# Patient Record
Sex: Male | Born: 1965 | Race: White | Hispanic: No | Smoking: Never smoker
Health system: Southern US, Community
[De-identification: ages and names within clinical notes are randomized; demographics above are authoritative.]

## PROBLEM LIST (undated history)

## (undated) DIAGNOSIS — N289 Disorder of kidney and ureter, unspecified: Secondary | ICD-10-CM

## (undated) DIAGNOSIS — IMO0001 Reserved for inherently not codable concepts without codable children: Secondary | ICD-10-CM

## (undated) DIAGNOSIS — E039 Hypothyroidism, unspecified: Secondary | ICD-10-CM

## (undated) DIAGNOSIS — I517 Cardiomegaly: Secondary | ICD-10-CM

## (undated) DIAGNOSIS — T753XXA Motion sickness, initial encounter: Secondary | ICD-10-CM

## (undated) DIAGNOSIS — J45909 Unspecified asthma, uncomplicated: Secondary | ICD-10-CM

## (undated) DIAGNOSIS — N059 Unspecified nephritic syndrome with unspecified morphologic changes: Secondary | ICD-10-CM

## (undated) DIAGNOSIS — R112 Nausea with vomiting, unspecified: Secondary | ICD-10-CM

## (undated) DIAGNOSIS — K219 Gastro-esophageal reflux disease without esophagitis: Secondary | ICD-10-CM

## (undated) DIAGNOSIS — J189 Pneumonia, unspecified organism: Secondary | ICD-10-CM

## (undated) DIAGNOSIS — M109 Gout, unspecified: Secondary | ICD-10-CM

## (undated) DIAGNOSIS — I10 Essential (primary) hypertension: Secondary | ICD-10-CM

## (undated) DIAGNOSIS — R011 Cardiac murmur, unspecified: Secondary | ICD-10-CM

## (undated) DIAGNOSIS — R002 Palpitations: Secondary | ICD-10-CM

## (undated) DIAGNOSIS — Z9889 Other specified postprocedural states: Secondary | ICD-10-CM

## (undated) DIAGNOSIS — E785 Hyperlipidemia, unspecified: Secondary | ICD-10-CM

## (undated) HISTORY — DX: Essential (primary) hypertension: I10

## (undated) HISTORY — DX: Hyperlipidemia, unspecified: E78.5

## (undated) HISTORY — DX: Gout, unspecified: M10.9

## (undated) HISTORY — DX: Unspecified nephritic syndrome with unspecified morphologic changes: N05.9

## (undated) HISTORY — DX: Cardiomegaly: I51.7

## (undated) HISTORY — PX: OTHER SURGICAL HISTORY: SHX169

## (undated) HISTORY — DX: Hypothyroidism, unspecified: E03.9

---

## 1999-01-16 ENCOUNTER — Ambulatory Visit (HOSPITAL_BASED_OUTPATIENT_CLINIC_OR_DEPARTMENT_OTHER): Admission: RE | Admit: 1999-01-16 | Discharge: 1999-01-16 | Payer: Self-pay | Admitting: Orthopedic Surgery

## 1999-01-19 ENCOUNTER — Emergency Department (HOSPITAL_COMMUNITY): Admission: EM | Admit: 1999-01-19 | Discharge: 1999-01-19 | Payer: Self-pay | Admitting: Emergency Medicine

## 2004-11-08 ENCOUNTER — Encounter: Admission: RE | Admit: 2004-11-08 | Discharge: 2004-11-08 | Payer: Self-pay | Admitting: Nephrology

## 2004-11-10 ENCOUNTER — Encounter: Admission: RE | Admit: 2004-11-10 | Discharge: 2004-11-10 | Payer: Self-pay | Admitting: Rheumatology

## 2005-05-09 ENCOUNTER — Ambulatory Visit: Payer: Self-pay | Admitting: Internal Medicine

## 2005-05-16 ENCOUNTER — Ambulatory Visit (HOSPITAL_COMMUNITY): Admission: RE | Admit: 2005-05-16 | Discharge: 2005-05-16 | Payer: Self-pay | Admitting: Nephrology

## 2005-06-20 ENCOUNTER — Ambulatory Visit (HOSPITAL_COMMUNITY): Admission: RE | Admit: 2005-06-20 | Discharge: 2005-06-20 | Payer: Self-pay | Admitting: Orthopedic Surgery

## 2005-10-06 ENCOUNTER — Ambulatory Visit (HOSPITAL_COMMUNITY): Admission: RE | Admit: 2005-10-06 | Discharge: 2005-10-06 | Payer: Self-pay | Admitting: Orthopedic Surgery

## 2008-06-12 ENCOUNTER — Encounter: Admission: RE | Admit: 2008-06-12 | Discharge: 2008-06-12 | Payer: Self-pay | Admitting: Critical Care Medicine

## 2008-07-06 ENCOUNTER — Encounter: Admission: RE | Admit: 2008-07-06 | Discharge: 2008-07-06 | Payer: Self-pay | Admitting: Nephrology

## 2009-06-18 ENCOUNTER — Emergency Department (HOSPITAL_COMMUNITY): Admission: EM | Admit: 2009-06-18 | Discharge: 2009-06-18 | Payer: Self-pay | Admitting: Emergency Medicine

## 2010-03-31 ENCOUNTER — Encounter: Payer: Self-pay | Admitting: Nephrology

## 2010-05-29 LAB — BASIC METABOLIC PANEL
BUN: 18 mg/dL (ref 6–23)
CO2: 28 mEq/L (ref 19–32)
Calcium: 9.3 mg/dL (ref 8.4–10.5)
Creatinine, Ser: 1.17 mg/dL (ref 0.4–1.5)
GFR calc Af Amer: >60 mL/min (ref >60)
Potassium: 3.7 mEq/L (ref 3.5–5.1)
Sodium: 141 mEq/L (ref 135–145)

## 2010-05-29 LAB — DIFFERENTIAL
Basophils Absolute: 0.1 10*3/uL (ref 0.0–0.1)
Monocytes Absolute: 0.8 10*3/uL (ref 0.1–1.0)
Monocytes Relative: 7 % (ref 3–12)
Neutro Abs: 9.2 10*3/uL — ABNORMAL HIGH (ref 1.7–7.7)

## 2010-05-29 LAB — CBC
HCT: 45.2 % (ref 39.0–52.0)
MCV: 90.2 fL (ref 78.0–100.0)
RDW: 12.7 % (ref 11.5–15.5)

## 2010-07-26 NOTE — Op Note (Signed)
[place]  Patient:    David Crosby                      MRN: [id number] Proc. Date: 01/16/99 Adm. Date:  [id number] Attending:  Milly Jakob CC:         Harvie Junior, M.D.                           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  OPERATION PERFORMED: 1. Open reduction internal fixation of first metatarsocuneiform joint. 2. Open reduction internal fixation of second metatarsocuneiform joint. 3. Open reduction internal fixation of third metatarsocuneiform joint. 4. Open reduction internal fixation of medial and second cuneiform joint. 5. Open reduction internal fixation between the medial and third cuneiform joint. 6. Open harvesting of bone graft from distal tibia for use in the fusions    described above.  SURGEON:  Harvie Junior, M.D.  ASSISTANT:  Kerby Less, P.A.  ANESTHESIA:  General.  BRIEF HISTORY:  He is a gentleman who has had a longterm history of a pain in his left foot dating back to some crushing trauma of some long period ago.  The patient has been previously evaluated with bone scan and noted to have bone scan positive at the metatarsocuneiform joints 1 through 3 and the intercuneiform joints.  He had failed prolonged conservative care with casting, anti-inflammatory medications, use of a cane and because of continued complaints of pain in these areas and positivity of bone scan, he was brought to the operating room for fusion of these joints.  DESCRIPTION OF PROCEDURE:  The patient was brought to the operating room and after adequate anesthesia was  obtained with general anesthetic, the patient was placed supine on the operating table.  The left leg was then prepped and draped in the  usual sterile fashion.  Following this, the leg was exsanguinated and a blood pressure tourniquet was inflated to 350 mmHg.  Following this, a dorsal medial incision was made over the first metatarsocuneiform joint.  The joint wsa identified and subperiosteal flaps were raised.  Following this, fluoroscopy was used and incision was made just lateral to the second cuneiform metatarsal joint and the subcutaneous tissues were dissected down to the level of the peristeum.  The subperiosteal flap was then raised from the medial border all the way to the third metatarsocuneiform joint.  The first, second and third metatarsocuneiform  joints were identified as well as the intercuneiform joints beween the first, second and third cuneiforms.  The joints were then denuded of all articular cartilage.  There was clearly evidence of arthritic injury between all of these  joints.  Once ronguer, curets and osteotomes had been used remove all excess cartilage between the two joints and the joints were scalloped and prepared for  fusion, the first metatarsocuneiform joint was evaluated first.  Crossing 3.5 mm cannulated screws were put in place to hold the metatarsal cuneiform joint togetheter and this compression was held across the joint while the screws were put in place.  Excellent fixation was achieved.  At this point attention was turned to the second  metatarsocuneiform joint where a 3.5 mm screw was used to fuse this joint.  Excellent compression was achieved.  A slight gap was developed between the first and second cuneiform at this point and then the third metatarsocuneiform joint was fused with 3.5 mm cannulated screw.  Following this, the bone graft which had been taken during the case was used to place in the gaps.  This was not enough to fill the  gaps and so bone graft was harvested from the distal tibia at a separate procedure.  The procedure was as follows:  An anterior incision was made just medial to the anterior tibialis muscle care being taken to identify and protect the saphenous vein.  Subcutaneous tissues dissected down to the level of the anterior tibial cortex where a window was made with an osteotome and copious cancellous bone was curetted out of this area.  Following this, the window was ut back in place and the periosteal elevation was put back over this cortical window and then the skin was closed with a combination of 4-0 Vicryl and 4-0 nylon. The graft which was harvested at this setting was then placed in between the intercuneiform joints as well as between the metatarsocuneiform joints.  Once the graft had been packed into place, attention was then turned towards a screw to old the cuneiforms first, second and third together.  A guide wire was passed and the screw was put across to hold this joint together.  Excellent compression was achieved with this screw fixation. Following this, attention was turned towards the dorsal wounds.  The tourniquet was let down.  The subperiosteal flap which was raised was reapproximated nicely over the dorsal aspect of the wound and then the skin was approximated with 4-0 Vicryl and the skin was closed with running horizontal mattress sutures.  A sterile compressive dressing was applied at this point and the patient was then taken to the recovery room where he was noted to be in satisfactory condition.  Estimated blood loss for this procedure was less than 50 cc. DD:  01/16/99 TD:  01/17/99 Job: [id number]

## 2010-07-26 NOTE — Op Note (Signed)
NAME:  David Crosby, David Crosby NO.:  [id number]   MEDICAL RECORD NO.:  [id number]          PATIENT TYPE:  AMB   LOCATION:  SDS                          FACILITY:  [facility]   PHYSICIAN:  Harvie Junior, M.D.   DATE OF BIRTH:  01-12-1966   DATE OF PROCEDURE:  06/20/2005  DATE OF DISCHARGE:                                 OPERATIVE REPORT   PREOPERATIVE DIAGNOSIS:  Painful left foot, status post open  reduction/internal fixation and fusion of tarsometatarsal joints, 1-3.   POSTOPERATIVE DIAGNOSIS:  1.  Painful left foot, status post open reduction/internal fixation and      fusion of tarsometatarsal joints, 1-3.  2.  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   PRINCIPLE PROCEDURE:  1.  Exploration of intertarsal joint with synovectomy, basically of the      naviculocuneiform joint.  2.  Hardware removal from the medial side.  3.  Hardware removal from the lateral side of the foot.  These are done      through three separate incisions.   SURGEON:  Harvie Junior, M.D.   ASSISTANT:  Marshia Ly, P.A.   ANESTHESIA:  General.   BRIEF HISTORY:  Mr. Ess is a 45 year old male with a long history of  having had a severe fracture/dislocation of the foot.  We ultimately treated  him with Lisfranc fracture/dislocation fusion surgery with five 3.5 mm  cannulated screws.  He ultimately did well with the surgery and was able to  work for a prolonged period of time but then began having increasing pain.  A lot of this pain was coming from the intertarsal joint, and we had Dr.  Modesto Charon in our office inject this joint, and that seemed to give him good pain  relief.  We talked about fusion of that joint but ultimately felt that might  be worth a try to take the hardware out and do an exploration and  synovectomy of the intertarsal joint to see if that would not help out some,  so he is brought to the operating room for this procedure.   PROCEDURE:  Patient  brought to the operating room.  After adequate  anesthesia was obtained with general anesthetic, the patient was placed  prone on the operating room table.  The left leg was prepped and draped in  the usual sterile fashion.  Following this, fluoroscopic imaging was used to  help isolate the screw heads and then once this was done, the old incision  was explored.  Immediately, the inner cuneiform screw was taken out.  The  cuneiform first metatarsal screws were removed through this medial incision.  Attention was then turned up into the naviculocuneiform joint to sort out  what was going on, is there any chance the screws were up there.  Put a  Therapist, nutritional in the joint, distracted the joint widely.  Did a complete  synovectomy.  Put the Freer down into the joint.  We really could not see  all the way down in.  It did not appear as though those screws came  into the  joint but ultimately felt that their removal would be appropriate.  We then  exploited the lateral foot incision, and through that lateral incision, we  took out the two screws, the most lateral of which, unfortunately, had  overgrown, and we were not able to quite get that screw out, so they  ultimately had to use the easy-out to get that out and had to clear quite a  bit of bone away to get to the screw head itself.  Ultimately, this was able  to be removed.  At that point, we then went back and copiously irrigated the  naviculocuneiform joint and then did a closure in layers.  Fluoro was used  throughout the case to help localize the screws, which were all buried under  bone.  At this point, the foot was  copiously irrigated and suctioned dry, closed in layers. Sterile compressive  dressing was applied as well as a posterior splint.  The patient was taken  to the recovery room and was noted to be in satisfactory condition.  Estimated blood loss for the procedure was none.      Harvie Junior, M.D.  Electronically  Signed     JLG/MEDQ  D:  06/20/2005  T:  06/20/2005  Job:  [id number]

## 2012-05-02 DIAGNOSIS — R079 Chest pain, unspecified: Secondary | ICD-10-CM

## 2012-05-03 ENCOUNTER — Encounter: Payer: Self-pay | Admitting: Cardiovascular Disease

## 2012-05-03 DIAGNOSIS — R0602 Shortness of breath: Secondary | ICD-10-CM

## 2012-05-03 DIAGNOSIS — R079 Chest pain, unspecified: Secondary | ICD-10-CM

## 2012-05-05 ENCOUNTER — Other Ambulatory Visit: Payer: Self-pay | Admitting: *Deleted

## 2012-05-05 DIAGNOSIS — R002 Palpitations: Secondary | ICD-10-CM

## 2012-05-11 ENCOUNTER — Encounter: Payer: Self-pay | Admitting: Cardiology

## 2012-05-13 ENCOUNTER — Ambulatory Visit: Payer: Self-pay | Admitting: *Deleted

## 2012-05-13 DIAGNOSIS — R002 Palpitations: Secondary | ICD-10-CM

## 2012-05-13 NOTE — Progress Notes (Signed)
Patient in office this morning for 48 hour holter monitor.  Placed with instructions given.  Will return monitor on 3/10.

## 2012-05-20 ENCOUNTER — Encounter: Payer: Self-pay | Admitting: Cardiovascular Disease

## 2012-05-20 ENCOUNTER — Ambulatory Visit (INDEPENDENT_AMBULATORY_CARE_PROVIDER_SITE_OTHER): Payer: Self-pay | Admitting: Cardiovascular Disease

## 2012-05-20 VITALS — BP 142/91 | HR 65 | Ht 71.0 in | Wt 287.1 lb

## 2012-05-20 DIAGNOSIS — I10 Essential (primary) hypertension: Secondary | ICD-10-CM | POA: Insufficient documentation

## 2012-05-20 DIAGNOSIS — R002 Palpitations: Secondary | ICD-10-CM | POA: Insufficient documentation

## 2012-05-20 NOTE — Assessment & Plan Note (Signed)
His blood pressure is reasonably controlled. He complains of daytime fatigue and should consider evaluation for sleep apnea.

## 2012-05-20 NOTE — Progress Notes (Signed)
HPI  This is a 46-male who is here today for followup visit after recent hospitalization. He has known history of hypertension, hypothyroidism and obesity. He has no previous cardiac history. He presented recently to [facility] with palpitations described as skipping in his heart associated with mild dizziness. On his initial evaluation there was a mention of chest discomfort. However, after discussion with him he was referring more to fluttering sensation in the chest without chest discomfort. He ruled out for myocardial infarction. Telemetry showed no arrhythmia. He had an echocardiogram done which showed normal LV systolic function, mild left ventricular hypertrophy and no significant valvular abnormalities or pulmonary hypertension.   he had a Holter monitor done but the results are not available. Since hospital discharge, he has been doing reasonably well. He had only mild palpitations but no other symptoms.  No Known Allergies   Current Outpatient Prescriptions on File Prior to Visit  Medication Sig Dispense Refill  . carvedilol (COREG) 25 MG tablet Take 25 mg by mouth 2 (two) times daily with a meal.      . levothyroxine (SYNTHROID, LEVOTHROID) 88 MCG tablet Take 88 mcg by mouth daily.      Marland Kitchen lisinopril (PRINIVIL,ZESTRIL) 20 MG tablet Take 20 mg by mouth daily.       No current facility-administered medications on file prior to visit.     Past Medical History  Diagnosis Date  . Gout   . Cardiomegaly     Seen on chest x-ray  . Hypothyroidism   . Hyperlipidemia   . Hypertension   . Nephritis      Past Surgical History  Procedure Laterality Date  . Left foot surgery      Multiple     Family History  Problem Relation Age of Onset  . Heart attack      Paternal and Maternal grandmother  . Hypertension      Several family member     History   Social History  . Marital Status: Married    Spouse Name: N/A    Number of Children: N/A  . Years of Education:  N/A   Occupational History  . Not on file.   Social History Main Topics  . Smoking status: Never Smoker   . Smokeless tobacco: Not on file  . Alcohol Use: No  . Drug Use: No  . Sexually Active: Not on file   Other Topics Concern  . Not on file   Social History Narrative  . No narrative on file     PHYSICAL EXAM   BP 142/91  Pulse 65  Ht 5\' 11"  (1.803 m)  Wt 287 lb 1.9 oz (130.237 kg)  BMI 40.06 kg/m2  SpO2 95% Constitutional: He is oriented to person, place, and time. He appears well-developed and well-nourished. No distress.  HENT: No nasal discharge.  Head: Normocephalic and atraumatic.  Eyes: Pupils are equal and round. Right eye exhibits no discharge. Left eye exhibits no discharge.  Neck: Normal range of motion. Neck supple. No JVD present. No thyromegaly present.  Cardiovascular: Normal rate, regular rhythm, normal heart sounds and. Exam reveals no gallop and no friction rub. No murmur heard.  Pulmonary/Chest: Effort normal and breath sounds normal. No stridor. No respiratory distress. He has no wheezes. He has no rales. He exhibits no tenderness.  Abdominal: Soft. Bowel sounds are normal. He exhibits no distension. There is no tenderness. There is no rebound and no guarding.  Musculoskeletal: Normal range of motion. He exhibits  no edema and no tenderness.  Neurological: He is alert and oriented to person, place, and time. Coordination normal.  Skin: Skin is warm and dry. No rash noted. He is not diaphoretic. No erythema. No pallor.  Psychiatric: He has a normal mood and affect. His behavior is normal. Judgment and thought content normal.       ASSESSMENT AND PLAN

## 2012-05-20 NOTE — Assessment & Plan Note (Signed)
His symptoms are suggestive of premature beats. Echocardiogram overall was unremarkable except for mild left ventricular hypertrophy. I will review the results of the Holter monitor once available and then notify him. I advised him to decrease caffeine intake. I also asked him to have his thyroid function checked if not already done. I discussed with him the importance of lifestyle modifications, regular exercise and weight loss. He has significant risk factors for coronary artery disease.

## 2012-05-20 NOTE — Patient Instructions (Addendum)
We will call you results of Holter monitor.  Follow up as needed.

## 2012-06-23 ENCOUNTER — Ambulatory Visit (INDEPENDENT_AMBULATORY_CARE_PROVIDER_SITE_OTHER): Payer: Self-pay | Admitting: Physician Assistant

## 2012-06-23 ENCOUNTER — Encounter: Payer: Self-pay | Admitting: Physician Assistant

## 2012-06-23 ENCOUNTER — Other Ambulatory Visit: Payer: Self-pay | Admitting: Physician Assistant

## 2012-06-23 VITALS — BP 153/94 | HR 64 | Ht 71.0 in | Wt 292.8 lb

## 2012-06-23 DIAGNOSIS — R002 Palpitations: Secondary | ICD-10-CM

## 2012-06-23 DIAGNOSIS — I10 Essential (primary) hypertension: Secondary | ICD-10-CM

## 2012-06-23 MED ORDER — AMLODIPINE BESYLATE 5 MG PO TABS: 5.0000 mg | ORAL_TABLET | Freq: Every day | ORAL | Status: DC

## 2012-06-23 MED ORDER — METOPROLOL TARTRATE 25 MG PO TABS: 25.0000 mg | ORAL_TABLET | Freq: Three times a day (TID) | ORAL | Status: DC

## 2012-06-23 MED ORDER — LISINOPRIL 20 MG PO TABS: 20.0000 mg | ORAL_TABLET | Freq: Two times a day (BID) | ORAL | Status: DC

## 2012-06-23 NOTE — Progress Notes (Signed)
Primary Cardiologist: David Gemma, MD   HPI: Patient returns for review of recent 47-hour Holter monitor, ordered by Dr. Kirke Corin at time of last OV, for ongoing evaluation of palpitations. He has normal LVF, by recent echocardiography, and no known history of CAD.   - 48-hour Holter monitor: NSR, rare PVC, rare PAC, brief atrial tach (< 6 beats)  Results reviewed by Dr. Kirke Corin, who recommended continued treatment with carvedilol.  Although he did not experience significant palpitations while wearing a 47 Holter monitor, he has had increased frequency and duration during these last 2-3 weeks. He refers to it as a "fluttering" sensation, lasting as long as 30 minutes in duration. He denies any associated presyncope/syncope. He has also been experiencing significant headaches, with associated SBP readings in the 160-70 range.  No Known Allergies  Current Outpatient Prescriptions  Medication Sig Dispense Refill  . aspirin 81 MG tablet Take 81 mg by mouth daily.      . carvedilol (COREG) 25 MG tablet Take 25 mg by mouth 2 (two) times daily with a meal.      . Febuxostat (ULORIC) 80 MG TABS Take 80 mg by mouth daily.      Marland Kitchen levothyroxine (SYNTHROID, LEVOTHROID) 88 MCG tablet Take 88 mcg by mouth daily.      Marland Kitchen lisinopril (PRINIVIL,ZESTRIL) 20 MG tablet Take 20 mg by mouth daily.       No current facility-administered medications for this visit.    Past Medical History  Diagnosis Date  . Gout   . Cardiomegaly     Seen on chest x-ray  . Hypothyroidism   . Hyperlipidemia   . Nephritis   . Hypertension     Past Surgical History  Procedure Laterality Date  . Left foot surgery      Multiple    History   Social History  . Marital Status: Married    Spouse Name: N/A    Number of Children: N/A  . Years of Education: N/A   Occupational History  . Not on file.   Social History Main Topics  . Smoking status: Never Smoker   . Smokeless tobacco: Not on file  . Alcohol Use: No  .  Drug Use: No  . Sexually Active: Not on file   Other Topics Concern  . Not on file   Social History Narrative  . No narrative on file    Family History  Problem Relation Age of Onset  . Heart attack      Paternal and Maternal grandmother  . Hypertension      Several family member    ROS: no nausea, vomiting; no fever, chills; no melena, hematochezia; no claudication  PHYSICAL EXAM: BP 153/94  Pulse 64  Ht 5\' 11"  (1.803 m)  Wt 292 lb 12.8 oz (132.813 kg)  BMI 40.86 kg/m2  SpO2 97% GENERAL: 47 year old male; NAD HEENT: NCAT, PERRLA, EOMI; sclera clear; no xanthelasma NECK: no bruits; unable to assess JVD, secondary to neck girth LUNGS: CTA bilaterally CARDIAC: RRR (S1, S2); no significant murmurs; no rubs or gallops ABDOMEN: soft, protuberant EXTREMETIES: no significant peripheral edema SKIN: warm/dry; no obvious rash/lesions MUSCULOSKELETAL: no joint deformity NEURO: no focal deficit; NL affect   EKG:    ASSESSMENT & PLAN:  Palpitations Will reevaluate with a 1 week CardioNet monitor, to assess the burden of these premature beats, previously documented as PACs and PVCs, as well as to allow more time for definitive exclusion of PAF, given that he has been experiencing this  on a near daily basis. Regarding medical therapy, I am not convinced that carvedilol is the best treatment modality for these ectopic beats. He was placed on this approximately 6 months ago, for treatment of HTN. He has NL LVF. Therefore, I will discontinue and substitute with Lopressor 25 twice a day, and reassess his clinical status with me in 2 weeks. We'll also order extensive labs including metabolic profile, magnesium level, and TSH.  Hypertension In lieu of carvedilol, will start amlodipine 5 mg daily and increase lisinopril to 20 mg twice a day. Lopressor is also being added, but for treatment of frequent palpitations.    Gene Grady Lucci, PAC

## 2012-06-23 NOTE — Assessment & Plan Note (Addendum)
Will reevaluate with a 1 week CardioNet monitor, to assess the burden of these premature beats, previously documented as PACs and PVCs, as well as to allow more time for definitive exclusion of PAF, given that he has been experiencing this on a near daily basis. Regarding medical therapy, I am not convinced that carvedilol is the best treatment modality for these ectopic beats. He was placed on this approximately 6 months ago, for treatment of HTN. He has NL LVF. Therefore, I will discontinue and substitute with Lopressor 25 twice a day, and reassess his clinical status with me in 2 weeks. We'll also order extensive labs including metabolic profile, magnesium level, and TSH.

## 2012-06-23 NOTE — Assessment & Plan Note (Signed)
In lieu of carvedilol, will start amlodipine 5 mg daily and increase lisinopril to 20 mg twice a day. Lopressor is also being added, but for treatment of frequent palpitations.

## 2012-06-23 NOTE — Patient Instructions (Signed)
   Labs for BMET, Magnesium, TSH  1 week heart monitor  Office will contact with results  Stop Coreg   Begin Lopressor 25mg  three times per day   Increase Lisinopril to 20mg  twice a day    Begin Norvasc 5mg  daily Follow up in  2-3 weeks

## 2012-06-24 LAB — BASIC METABOLIC PANEL
CO2: 25 mEq/L (ref 19–32)
Calcium: 9.7 mg/dL (ref 8.4–10.5)
Chloride: 104 mEq/L (ref 96–112)
Creat: 0.94 mg/dL (ref 0.50–1.35)
Glucose, Bld: 85 mg/dL (ref 70–99)

## 2012-06-24 LAB — MAGNESIUM: Magnesium: 2 mg/dL (ref 1.5–2.5)

## 2012-06-24 LAB — TSH: TSH: 2.997 u[IU]/mL (ref 0.350–4.500)

## 2012-06-28 ENCOUNTER — Other Ambulatory Visit: Payer: Self-pay | Admitting: *Deleted

## 2012-06-28 ENCOUNTER — Encounter: Payer: Self-pay | Admitting: *Deleted

## 2012-06-28 DIAGNOSIS — R002 Palpitations: Secondary | ICD-10-CM

## 2012-06-29 ENCOUNTER — Encounter: Payer: Self-pay | Admitting: *Deleted

## 2012-06-30 DIAGNOSIS — R002 Palpitations: Secondary | ICD-10-CM

## 2012-07-06 ENCOUNTER — Telehealth: Payer: Self-pay | Admitting: Physician Assistant

## 2012-07-06 NOTE — Telephone Encounter (Signed)
Received call from [facility] that patient had transient 3 second pause, sinus bradycardia at 6:28am. Attempted to call patient, LMOM to call us back to see how he is feeling. Monitor had been placed for palpitations but not dizziness or syncope. Suspect pause was r/t sleeping. Strips will be faxed to the office. Will forward to Gene Serpe and [facility] team for review. Reannah Totten PA-C

## 2012-07-06 NOTE — Telephone Encounter (Signed)
Received msg that patient was trying to return my call. I called patient back but again got his voicemail. Was calling to see how he was feeling given his 3-sec pause. I have contacted the [facility] office and spoke with Inocencio Homes RN and they will follow up on trying to contact him again. Jentry Mcqueary PA-C

## 2012-07-06 NOTE — Telephone Encounter (Signed)
Patient returned call.  States he was sleeping this morning and is feeling fine now.  BP & HR checked on home machine now is 154/95  68.  States he did feel like he had a lot of palpitations yesterday.  Already has follow up OV scheduled for 5/7 with Gene Serpe, PA.

## 2012-07-06 NOTE — Telephone Encounter (Signed)
Follow-up:    Patient called in returning your call. Transferred patient to the [place] office for assistance but they declined to speak with him stating that you were the one who called him.  Please call patient back.

## 2012-07-06 NOTE — Telephone Encounter (Signed)
Left message to return call 

## 2012-07-07 ENCOUNTER — Other Ambulatory Visit: Payer: Self-pay | Admitting: *Deleted

## 2012-07-07 MED ORDER — AMLODIPINE BESYLATE 5 MG PO TABS: 10.0000 mg | ORAL_TABLET | Freq: Every day | ORAL | Status: DC

## 2012-07-07 MED ORDER — METOPROLOL TARTRATE 25 MG PO TABS: 12.5000 mg | ORAL_TABLET | Freq: Three times a day (TID) | ORAL | Status: DC

## 2012-07-14 ENCOUNTER — Telehealth: Payer: Self-pay | Admitting: *Deleted

## 2012-07-14 ENCOUNTER — Encounter: Payer: Self-pay | Admitting: Physician Assistant

## 2012-07-14 ENCOUNTER — Ambulatory Visit (INDEPENDENT_AMBULATORY_CARE_PROVIDER_SITE_OTHER): Payer: Self-pay | Admitting: Physician Assistant

## 2012-07-14 VITALS — BP 119/80 | HR 62 | Ht 71.0 in | Wt 293.0 lb

## 2012-07-14 DIAGNOSIS — R002 Palpitations: Secondary | ICD-10-CM

## 2012-07-14 MED ORDER — PINDOLOL 10 MG PO TABS: 10.0000 mg | ORAL_TABLET | Freq: Two times a day (BID) | ORAL | Status: DC

## 2012-07-14 MED ORDER — PINDOLOL 5 MG PO TABS: 5.0000 mg | ORAL_TABLET | Freq: Two times a day (BID) | ORAL | Status: DC

## 2012-07-14 NOTE — Telephone Encounter (Signed)
In for OV today with David Crosby.  Give new rx for Pindolol 5mg  twice a day.  Cost is $88.20.  Patient does not have insurance.  Can this be changed to anything cheaper?

## 2012-07-14 NOTE — Progress Notes (Signed)
Primary Cardiologist: Mady Gemma, MD   HPI: Scheduled early followup, and review of extended event monitoring for ongoing evaluation of palpitations.   During recent OV, I reviewed results of recent 48-hour Holter monitor, ordered by Dr. Kirke Corin, which yielded brief atrial tachycardia, and rare PVCs and PACs. Given his complaint of worsening palpitations, I recommended extending the monitoring period. Although this did not reveal any definite atrial fibrillation/flutter, there were documented 3.0 second pauses around 4 AM, with underlying sinus rhythm.  Regarding medication adjustments, I had substituted carvedilol with Lopressor 25 mg 3 times a day, so as to better suppress his palpitations, and increased lisinopril to 20 daily and added Norvasc 5 mg daily, for more aggressive management of HTN. However, following results of the one week monitor, I weaned him off Lopressor, which he just finished today, and increased Norvasc to 10 mg daily.   He continues to have occasional palpitations, as previously outlined, and suggests that they are more of a bother, than clinically significant. He denies any near syncope or frank syncope, and seems more bothered by these recurrent episodes. As previously outlined, they sometimes last as long as 30 minutes in duration.  No Known Allergies  Current Outpatient Prescriptions  Medication Sig Dispense Refill  . amLODipine (NORVASC) 5 MG tablet Take 2 tablets (10 mg total) by mouth daily.      Marland Kitchen aspirin 81 MG tablet Take 81 mg by mouth daily.      . Febuxostat (ULORIC) 80 MG TABS Take 80 mg by mouth daily.      Marland Kitchen levothyroxine (SYNTHROID, LEVOTHROID) 88 MCG tablet Take 88 mcg by mouth daily.      Marland Kitchen lisinopril (PRINIVIL,ZESTRIL) 20 MG tablet Take 1 tablet (20 mg total) by mouth 2 (two) times daily.  60 tablet  6  . pindolol (VISKEN) 5 MG tablet Take 1 tablet (5 mg total) by mouth 2 (two) times daily.  60 tablet  6   No current facility-administered medications  for this visit.    Past Medical History  Diagnosis Date  . Gout   . Cardiomegaly     Seen on chest x-ray  . Hypothyroidism   . Hyperlipidemia   . Nephritis   . Hypertension     Past Surgical History  Procedure Laterality Date  . Left foot surgery      Multiple    History   Social History  . Marital Status: Married    Spouse Name: N/A    Number of Children: N/A  . Years of Education: N/A   Occupational History  . Not on file.   Social History Main Topics  . Smoking status: Never Smoker   . Smokeless tobacco: Never Used  . Alcohol Use: No  . Drug Use: No  . Sexually Active: Not on file   Other Topics Concern  . Not on file   Social History Narrative  . No narrative on file    Family History  Problem Relation Age of Onset  . Heart attack      Paternal and Maternal grandmother  . Hypertension      Several family member    ROS: no nausea, vomiting; no fever, chills; no melena, hematochezia; no claudication  PHYSICAL EXAM: BP 119/80  Pulse 62  Ht 5\' 11"  (1.803 m)  Wt 293 lb (132.904 kg)  BMI 40.88 kg/m2 GENERAL: 47 year old male; NAD  HEENT: NCAT, PERRLA, EOMI; sclera clear; no xanthelasma  NECK: no bruits; unable to assess JVD, secondary to neck  girth  LUNGS: CTA bilaterally  CARDIAC: RRR (S1, S2); no significant murmurs; no rubs or gallops  ABDOMEN: soft, protuberant  EXTREMETIES: no significant peripheral edema  SKIN: warm/dry; no obvious rash/lesions  MUSCULOSKELETAL: no joint deformity  NEURO: no focal deficit; NL affect    EKG:    ASSESSMENT & PLAN:  Hypertension Much improved following recent medication adjustments, including increasing amlodipine to full dose.  Palpitations Results of recent extended monitoring were reviewed with the patient, including the 3.0 second pause noted during early morning hours. Given this, I weaned him off Lopressor. Nevertheless, he would prefer to not experience these recurrent palpitations. Therefore,  I will place him on pindolol 5 mg twice a day, given that it has ISA properties and will hopefully prevent recurrent, profound bradycardia, albeit nocturnal. I also wonder if this may not be due to sleep apnea, which he states has been evaluated in the past, but for which he apparently did not qualify for CPAP. I do not feel that further monitoring would yield any more results than have been documented thus far: that is, rare PVCs and PACs, with brief atrial tachycardia and no definite atrial fibrillation/flutter.    Gene Miquel Stacks, PAC

## 2012-07-14 NOTE — Assessment & Plan Note (Signed)
Results of recent extended monitoring were reviewed with the patient, including the 3.0 second pause noted during early morning hours. Given this, I weaned him off Lopressor. Nevertheless, he would prefer to not experience these recurrent palpitations. Therefore, I will place him on pindolol 5 mg twice a day, given that it has ISA properties and will hopefully prevent recurrent, profound bradycardia, albeit nocturnal. I also wonder if this may not be due to sleep apnea, which he states has been evaluated in the past, but for which he apparently did not qualify for CPAP. I do not feel that further monitoring would yield any more results than have been documented thus far: that is, rare PVCs and PACs, with brief atrial tachycardia and no definite atrial fibrillation/flutter.

## 2012-07-14 NOTE — Assessment & Plan Note (Signed)
Much improved following recent medication adjustments, including increasing amlodipine to full dose.

## 2012-07-14 NOTE — Patient Instructions (Addendum)
   Begin Pindolol 5mg  twice a day   Continue all other current medications. Follow up in  2 weeks

## 2012-07-16 MED ORDER — ACEBUTOLOL HCL 200 MG PO CAPS: 200.0000 mg | ORAL_CAPSULE | Freq: Two times a day (BID) | ORAL | Status: DC

## 2012-07-16 NOTE — Telephone Encounter (Signed)
Discussed below with Gene Serpe, PA.  Could try Acebutolol (Sectral) 200mg  twice a day.  Call placed to [facility] & his cost would be $44.05.    Notified patient of this & he stated that he would try this medication.  Cost is about half of the Pindolol.

## 2012-07-29 ENCOUNTER — Ambulatory Visit (INDEPENDENT_AMBULATORY_CARE_PROVIDER_SITE_OTHER): Payer: Self-pay | Admitting: Physician Assistant

## 2012-07-29 ENCOUNTER — Encounter: Payer: Self-pay | Admitting: *Deleted

## 2012-07-29 ENCOUNTER — Encounter: Payer: Self-pay | Admitting: Physician Assistant

## 2012-07-29 VITALS — BP 127/78 | HR 66 | Ht 71.0 in | Wt 291.1 lb

## 2012-07-29 DIAGNOSIS — R002 Palpitations: Secondary | ICD-10-CM

## 2012-07-29 DIAGNOSIS — I10 Essential (primary) hypertension: Secondary | ICD-10-CM

## 2012-07-29 DIAGNOSIS — R079 Chest pain, unspecified: Secondary | ICD-10-CM | POA: Insufficient documentation

## 2012-07-29 NOTE — Assessment & Plan Note (Signed)
Markedly improved following recent addition of acebutolol. Continue current dose and reassess clinical status at followup visit.

## 2012-07-29 NOTE — Assessment & Plan Note (Signed)
Will evaluate further with a dobutamine stress echocardiogram to rule out underlying occult ischemia. Patient reports having had a negative stress test, approximately 10 years ago (report unavailable). He has never undergone coronary angiography. He does have CRFs notable for HTN, HLD, and age, and is concerned about these new symptoms. If the study is negative, then no further workup is indicated and he can resume followup in 6 months with Dr. Kirke Corin. If, however, there is any suggestion of ischemia, then we'll have him return and discuss more aggressive evaluation with a diagnostic coronary angiogram. Patient is agreeable with this plan. Acebutolol will be held a.m. of test. Of note, patient had an echocardiogram in February of this year, revealing NL LVF (EF 55-60%), and no noted WMAs.

## 2012-07-29 NOTE — Patient Instructions (Addendum)
   Dobutamine stress echo  Office will contact with results Continue all current medications. Your physician wants you to follow up in: 6 months.  You will receive a reminder letter in the mail one-two months in advance.  If you don't receive a letter, please call our office to schedule the follow up appointment

## 2012-07-29 NOTE — Assessment & Plan Note (Signed)
Remains very well controlled, following our addition of amlodipine, now at full dose, and up titration of ACE inhibitor.

## 2012-07-29 NOTE — Progress Notes (Signed)
Primary Cardiologist: Mady Gemma, MD   HPI: Scheduled 2 week followup.  At time of recent OV, I recommended treatment with Pindol for management of symptomatic palpitations with documented PVCs, PACs, and brief atrial tachycardia, by recent event monitoring. There was no definite atrial fibrillation/flutter. Documented 3.0 second pauses at 4 AM, with underlying NSR, were noted. In light of the marked nocturnal bradycardia, I recommended substituting Lopressor with a beta blocker with ISA properties, instead.  He returns today reporting marked improvement overall in the frequency and intensity of his palpitations. Since starting acebutolol (less expensive than pindolol), he notes an "80%" reduction in his palpitations. He is quite pleased and feels a "whole lot better".  However, he has also noted interim development of new, mild (5/10) pansternal chest pain, characterized as dull, with no associated symptoms or radiation. These have occurred only while sitting, and last only a few seconds in duration. He denies any exertional CP. His last episode occurred approximately one week ago.  No Known Allergies  Current Outpatient Prescriptions  Medication Sig Dispense Refill  . acebutolol (SECTRAL) 200 MG capsule Take 1 capsule (200 mg total) by mouth 2 (two) times daily.  60 capsule  6  . amLODipine (NORVASC) 10 MG tablet Take 10 mg by mouth daily.      Marland Kitchen aspirin 81 MG tablet Take 81 mg by mouth daily.      . Febuxostat (ULORIC) 80 MG TABS Take 80 mg by mouth daily.      Marland Kitchen levothyroxine (SYNTHROID, LEVOTHROID) 88 MCG tablet Take 88 mcg by mouth daily.      Marland Kitchen lisinopril (PRINIVIL,ZESTRIL) 20 MG tablet Take 1 tablet (20 mg total) by mouth 2 (two) times daily.  60 tablet  6   No current facility-administered medications for this visit.    Past Medical History  Diagnosis Date  . Gout   . Cardiomegaly     Seen on chest x-ray  . Hypothyroidism   . Hyperlipidemia   . Nephritis   . Hypertension      Past Surgical History  Procedure Laterality Date  . Left foot surgery      Multiple    History   Social History  . Marital Status: Married    Spouse Name: N/A    Number of Children: N/A  . Years of Education: N/A   Occupational History  . Not on file.   Social History Main Topics  . Smoking status: Never Smoker   . Smokeless tobacco: Never Used  . Alcohol Use: No  . Drug Use: No  . Sexually Active: Not on file   Other Topics Concern  . Not on file   Social History Narrative  . No narrative on file    Family History  Problem Relation Age of Onset  . Heart attack      Paternal and Maternal grandmother  . Hypertension      Several family member    ROS: no nausea, vomiting; no fever, chills; no melena, hematochezia; no claudication  PHYSICAL EXAM: BP 127/78  Pulse 66  Ht 5\' 11"  (1.803 m)  Wt 291 lb 1.9 oz (132.051 kg)  BMI 40.62 kg/m2  SpO2 95% GENERAL: 47 year old male, obese; NAD  HEENT: NCAT, PERRLA, EOMI; sclera clear; no xanthelasma  NECK: no bruits; unable to assess JVD, secondary to neck girth  LUNGS: CTA bilaterally  CARDIAC: RRR (S1, S2); no significant murmurs; no rubs or gallops  ABDOMEN: soft, protuberant  EXTREMETIES: no significant peripheral edema  SKIN:  warm/dry; no obvious rash/lesions  MUSCULOSKELETAL: no joint deformity  NEURO: no focal deficit; NL affect    EKG:    ASSESSMENT & PLAN:  Palpitations Markedly improved following recent addition of acebutolol. Continue current dose and reassess clinical status at followup visit.  Hypertension Remains very well controlled, following our addition of amlodipine, now at full dose, and up titration of ACE inhibitor.  Chest pain Will evaluate further with a dobutamine stress echocardiogram to rule out underlying occult ischemia. Patient reports having had a negative stress test, approximately 10 years ago (report unavailable). He has never undergone coronary angiography. He does  have CRFs notable for HTN, HLD, and age, and is concerned about these new symptoms. If the study is negative, then no further workup is indicated and he can resume followup in 6 months with Dr. Kirke Corin. If, however, there is any suggestion of ischemia, then we'll have him return and discuss more aggressive evaluation with a diagnostic coronary angiogram. Patient is agreeable with this plan. Acebutolol will be held a.m. of test. Of note, patient had an echocardiogram in February of this year, revealing NL LVF (EF 55-60%), and no noted WMAs.    Gene Tamarra Geiselman, PAC

## 2012-07-30 ENCOUNTER — Ambulatory Visit: Payer: Self-pay | Admitting: Physician Assistant

## 2012-08-05 ENCOUNTER — Other Ambulatory Visit: Payer: Self-pay | Admitting: *Deleted

## 2012-08-05 DIAGNOSIS — R002 Palpitations: Secondary | ICD-10-CM

## 2012-08-11 ENCOUNTER — Ambulatory Visit (HOSPITAL_COMMUNITY)
Admission: RE | Admit: 2012-08-11 | Discharge: 2012-08-11 | Disposition: A | Discharge status: Home / Self Care | Payer: Medicaid Other | Source: Ambulatory Visit | Attending: Physician Assistant | Admitting: Physician Assistant

## 2012-08-11 ENCOUNTER — Encounter (HOSPITAL_COMMUNITY): Payer: Self-pay

## 2012-08-11 DIAGNOSIS — R079 Chest pain, unspecified: Secondary | ICD-10-CM

## 2012-08-11 DIAGNOSIS — R002 Palpitations: Secondary | ICD-10-CM

## 2012-08-11 DIAGNOSIS — I10 Essential (primary) hypertension: Secondary | ICD-10-CM | POA: Insufficient documentation

## 2012-08-11 MED ORDER — ATROPINE SULFATE 1 MG/ML IJ SOLN
INTRAMUSCULAR | Status: AC
  Filled 2012-08-11: qty 1

## 2012-08-11 MED ORDER — DOBUTAMINE IN D5W 4-5 MG/ML-% IV SOLN
INTRAVENOUS | Status: AC
  Filled 2012-08-11: qty 250

## 2012-08-11 NOTE — Progress Notes (Signed)
*  PRELIMINARY RESULTS* Echocardiogram Echocardiogram Pharmacologic Stress Test has been performed.  Conrad St[place] Clair 08/11/2012, 12:03 PM

## 2012-08-11 NOTE — Progress Notes (Signed)
Stress Lab Nurses Notes - [facility]  JOSEDANIEL HAYE 08/11/2012 Reason for doing test: Chest Pain and palpitation Type of test: Dobutamine Echo Nurse performing test: Parke Poisson, RN Nuclear Medicine Tech: Not Applicable Echo Tech: Karrie Doffing MD performing test: R. Rothbart & Jacolyn Reedy PA Family MD: Charm Barges Test explained and consent signed: yes IV started: 22g jelco, NS 250 cc, KVO and No redness or edema Symptoms: Nausea Treatment/Intervention: None Reason test stopped: protocol completed After recovery IV was: Discontinued and No redness or edema Patient to return to Nuc. Med at : NA Patient discharged: Home Patient's Condition upon discharge was: stable Comments: During test peak BP 170/67 & HR 136.  Rrecovery BP 120/83 & HR 78.  Symptoms resolved in recovery. Erskine Speed T

## 2012-08-16 ENCOUNTER — Telehealth: Payer: Self-pay | Admitting: Physician Assistant

## 2012-08-16 NOTE — Telephone Encounter (Signed)
Asking for test results on test stress done last week at [facility].  Please call patient.

## 2012-08-18 NOTE — Telephone Encounter (Signed)
Patient notified results has been routed to [employer], [place].  Will notify as soon as I get reply from provider.  Patient verbalized understanding.

## 2012-08-19 ENCOUNTER — Encounter: Payer: Self-pay | Admitting: Cardiology

## 2012-08-20 NOTE — Telephone Encounter (Signed)
Left message to return call 

## 2012-08-20 NOTE — Telephone Encounter (Signed)
Patient notified of negative dobutamine stress echo.

## 2012-12-20 ENCOUNTER — Encounter (HOSPITAL_COMMUNITY): Payer: Self-pay | Admitting: Emergency Medicine

## 2012-12-20 ENCOUNTER — Emergency Department (HOSPITAL_COMMUNITY): Payer: Medicaid Other

## 2012-12-20 ENCOUNTER — Emergency Department (HOSPITAL_COMMUNITY)
Admission: EM | Admit: 2012-12-20 | Discharge: 2012-12-20 | Disposition: A | Discharge status: Home / Self Care | Payer: Medicaid Other | Attending: Emergency Medicine | Admitting: Emergency Medicine

## 2012-12-20 DIAGNOSIS — Z7982 Long term (current) use of aspirin: Secondary | ICD-10-CM | POA: Insufficient documentation

## 2012-12-20 DIAGNOSIS — E785 Hyperlipidemia, unspecified: Secondary | ICD-10-CM | POA: Insufficient documentation

## 2012-12-20 DIAGNOSIS — N201 Calculus of ureter: Secondary | ICD-10-CM | POA: Insufficient documentation

## 2012-12-20 DIAGNOSIS — R1032 Left lower quadrant pain: Secondary | ICD-10-CM | POA: Insufficient documentation

## 2012-12-20 DIAGNOSIS — R112 Nausea with vomiting, unspecified: Secondary | ICD-10-CM | POA: Insufficient documentation

## 2012-12-20 DIAGNOSIS — E039 Hypothyroidism, unspecified: Secondary | ICD-10-CM | POA: Insufficient documentation

## 2012-12-20 DIAGNOSIS — I517 Cardiomegaly: Secondary | ICD-10-CM | POA: Insufficient documentation

## 2012-12-20 DIAGNOSIS — N23 Unspecified renal colic: Secondary | ICD-10-CM

## 2012-12-20 DIAGNOSIS — I10 Essential (primary) hypertension: Secondary | ICD-10-CM | POA: Insufficient documentation

## 2012-12-20 DIAGNOSIS — R61 Generalized hyperhidrosis: Secondary | ICD-10-CM | POA: Insufficient documentation

## 2012-12-20 DIAGNOSIS — Z79899 Other long term (current) drug therapy: Secondary | ICD-10-CM | POA: Insufficient documentation

## 2012-12-20 DIAGNOSIS — M549 Dorsalgia, unspecified: Secondary | ICD-10-CM | POA: Insufficient documentation

## 2012-12-20 LAB — BASIC METABOLIC PANEL
CO2: 29 mEq/L (ref 19–32)
Calcium: 9.3 mg/dL (ref 8.4–10.5)
Creatinine, Ser: 1.59 mg/dL — ABNORMAL HIGH (ref 0.50–1.35)

## 2012-12-20 LAB — CBC WITH DIFFERENTIAL/PLATELET
Basophils Absolute: 0 10*3/uL (ref 0.0–0.1)
Eosinophils Relative: 4 % (ref 0–5)
HCT: 41.4 % (ref 39.0–52.0)
Lymphocytes Relative: 16 % (ref 12–46)
MCH: 30.8 pg (ref 26.0–34.0)
MCHC: 34.5 g/dL (ref 30.0–36.0)
MCV: 89 fL (ref 78.0–100.0)
Monocytes Absolute: 1 10*3/uL (ref 0.1–1.0)
RDW: 12.8 % (ref 11.5–15.5)
WBC: 11.6 10*3/uL — ABNORMAL HIGH (ref 4.0–10.5)

## 2012-12-20 LAB — URINALYSIS, ROUTINE W REFLEX MICROSCOPIC
Bilirubin Urine: NEGATIVE
Glucose, UA: NEGATIVE mg/dL
Ketones, ur: NEGATIVE mg/dL
Protein, ur: NEGATIVE mg/dL

## 2012-12-20 LAB — URINE MICROSCOPIC-ADD ON

## 2012-12-20 MED ORDER — MORPHINE SULFATE 4 MG/ML IJ SOLN
4.0000 mg | Freq: Once | INTRAMUSCULAR | Status: AC
  Administered 2012-12-20: 4 mg via INTRAVENOUS
  Filled 2012-12-20: qty 1

## 2012-12-20 MED ORDER — HYDROCODONE-ACETAMINOPHEN 5-325 MG PO TABS: 1.0000 | ORAL_TABLET | ORAL | Status: DC | PRN

## 2012-12-20 NOTE — ED Provider Notes (Signed)
CSN: [id number]     Arrival date & time 12/20/12  1625 History   First MD Initiated Contact with Patient 12/20/12 2006     Chief Complaint  Patient presents with  . Back Pain  . Abdominal Pain    Patient is a 47 y.o. male presenting with back pain and abdominal pain. The history is provided by the patient.  Back Pain Pain location: left flank area. Quality:  Aching Radiates to: left abdomen. Pain severity:  Moderate (while waiting he went and urinated and since then has felt much better) Onset quality:  Gradual Duration:  1 day Timing:  Constant Progression:  Resolved Chronicity:  New Relieved by:  Nothing Ineffective treatments:  None tried Associated symptoms: abdominal pain   Associated symptoms: no chest pain, no dysuria, no fever, no leg pain, no perianal numbness and no tingling   Abdominal Pain Associated symptoms: nausea and vomiting   Associated symptoms: no chest pain, no dysuria and no fever     Past Medical History  Diagnosis Date  . Gout   . Cardiomegaly     Seen on chest x-ray  . Hypothyroidism   . Hyperlipidemia   . Nephritis   . Hypertension    Past Surgical History  Procedure Laterality Date  . Left foot surgery      Multiple   Family History  Problem Relation Age of Onset  . Heart attack      Paternal and Maternal grandmother  . Hypertension      Several family member   History  Substance Use Topics  . Smoking status: Never Smoker   . Smokeless tobacco: Never Used  . Alcohol Use: No    Review of Systems  Constitutional: Positive for diaphoresis. Negative for fever.  Cardiovascular: Negative for chest pain.  Gastrointestinal: Positive for nausea, vomiting and abdominal pain.  Genitourinary: Negative for dysuria.  Musculoskeletal: Positive for back pain.  Neurological: Negative for tingling.    Allergies  Review of patient's allergies indicates no known allergies.  Home Medications   Current Outpatient Rx  Name  Route  Sig   Dispense  Refill  . acebutolol (SECTRAL) 200 MG capsule   Oral   Take 1 capsule (200 mg total) by mouth 2 (two) times daily.   60 capsule   6     Stop Pindolol, change to Acebutolol on 07/16/2012 du ...   . amLODipine (NORVASC) 5 MG tablet   Oral   Take 5 mg by mouth daily.         Marland Kitchen aspirin 81 MG tablet   Oral   Take 81 mg by mouth daily.         Marland Kitchen ibuprofen (ADVIL,MOTRIN) 200 MG tablet   Oral   Take 200 mg by mouth every 6 (six) hours as needed for pain.         Marland Kitchen levothyroxine (SYNTHROID, LEVOTHROID) 100 MCG tablet   Oral   Take 100 mcg by mouth daily before breakfast.         . lisinopril (PRINIVIL,ZESTRIL) 20 MG tablet   Oral   Take 1 tablet (20 mg total) by mouth 2 (two) times daily.   60 tablet   6     Dose increased 06/23/2012   . lovastatin (MEVACOR) 20 MG tablet   Oral   Take 40 mg by mouth at bedtime.         Marland Kitchen HYDROcodone-acetaminophen (NORCO/VICODIN) 5-325 MG per tablet   Oral   Take 1-2 tablets  by mouth every 4 (four) hours as needed for pain.   16 tablet   0    BP 144/85  Pulse 72  Temp(Src) 97.9 F (36.6 C) (Oral)  Resp 18  Ht 6' (1.829 m)  Wt 291 lb (131.997 kg)  BMI 39.46 kg/m2  SpO2 95% Physical Exam  Nursing note and vitals reviewed. Constitutional: He appears well-developed and well-nourished. No distress.  HENT:  Head: Normocephalic and atraumatic.  Right Ear: External ear normal.  Left Ear: External ear normal.  Eyes: Conjunctivae are normal. Right eye exhibits no discharge. Left eye exhibits no discharge. No scleral icterus.  Neck: Neck supple. No tracheal deviation present.  Cardiovascular: Normal rate, regular rhythm and intact distal pulses.   Pulmonary/Chest: Effort normal and breath sounds normal. No stridor. No respiratory distress. He has no wheezes. He has no rales.  Abdominal: Soft. Bowel sounds are normal. He exhibits no distension. There is no tenderness. There is CVA tenderness. There is no rebound and no  guarding.  Musculoskeletal: He exhibits no edema and no tenderness.       Lumbar back: He exhibits no tenderness.  Neurological: He is alert. He has normal strength. No sensory deficit. Cranial nerve deficit:  no gross defecits noted. He exhibits normal muscle tone. He displays no seizure activity. Coordination normal.  Skin: Skin is warm and dry. No rash noted.  Psychiatric: He has a normal mood and affect.    ED Course  Procedures (including critical care time) Labs Review Labs Reviewed  URINALYSIS, ROUTINE W REFLEX MICROSCOPIC - Abnormal; Notable for the following:    Hgb urine dipstick TRACE (*)    All other components within normal limits  CBC WITH DIFFERENTIAL - Abnormal; Notable for the following:    WBC 11.6 (*)    Neutro Abs 8.2 (*)    All other components within normal limits  BASIC METABOLIC PANEL - Abnormal; Notable for the following:    Glucose, Bld 115 (*)    Creatinine, Ser 1.59 (*)    GFR calc non Af Amer 50 (*)    GFR calc Af Amer 58 (*)    All other components within normal limits  URINE MICROSCOPIC-ADD ON   Imaging Review Ct Abdomen Pelvis Wo Contrast  12/20/2012   CLINICAL DATA:  Left lower quadrant pain and back pain.  EXAM: CT ABDOMEN AND PELVIS WITHOUT CONTRAST  TECHNIQUE: Multidetector CT imaging of the abdomen and pelvis was performed following the standard protocol without intravenous contrast.  COMPARISON:  06/18/2009 CT.  FINDINGS: Clear lung bases. Mild cardiac enlargement.  Fatty infiltration of the liver without focal defects. Normal-appearing gallbladder, spleen, pancreas, and adrenal glands.  4 x 8 mm nonobstructing left lower pole renal calculus has grown from priors. Left renal cortical hypodensities again noted likely corresponding to previous cysts noted in 2010. Moderate left perinephric stranding without evidence for distal ureteral calculus. Mild left hydronephrosis. Left ureter slightly dilated. I suspect there has been recent passage of a stone.   No right renal abnormalities. No hydronephrosis on the right.  Normal-appearing aorta with only mild mural calcification. Negative IVC. No adenopathy.  Normal-appearing stomach, and small bowel. Diverticulosis without diverticulitis, most notable in the transverse colon. Normal appendix.  Normal-appearing bladder. Normal prostate and seminal vesicles. No free fluid or free air. Negative osseous structures.  IMPRESSION: Mild left hydronephrosis, left perinephric stranding, and hydroureter without obstructing ureteral calculus or visible bladder calculus. I suspect there has been recent passage of a left renal calculus.  A 4 x 8 mm stone lies within the dependent portion of the left renal collecting system.  Hepatic steatosis.  No evidence for diverticulitis or intra-abdominal inflammatory process.   Electronically Signed   By: Davonna Belling M.D.   On: 12/20/2012 21:39    EKG Interpretation   None      Medications  morphine 4 MG/ML injection 4 mg (4 mg Intravenous Given 12/20/12 2109)    MDM   1. Ureteral colic    The patient's CT scan suggesting recently passed ureteral stone. This correlates with the patient's history. He felt like his symptoms resolved after urinating in the waiting room.  I will discharge the patient home with a prescription for pain medications to take as needed.   Celene Kras, MD 12/20/12 2155

## 2012-12-20 NOTE — ED Notes (Signed)
Pt reports back pain and left lower quad ab pain since this am, denies any urinary s/s, +nausea, fever off/on. No h/o of stones.

## 2012-12-30 ENCOUNTER — Other Ambulatory Visit: Payer: Self-pay

## 2012-12-30 MED ORDER — AMLODIPINE BESYLATE 5 MG PO TABS: 5.0000 mg | ORAL_TABLET | Freq: Every day | ORAL | Status: DC

## 2013-01-26 ENCOUNTER — Ambulatory Visit: Payer: Self-pay | Admitting: Cardiology

## 2013-02-18 ENCOUNTER — Other Ambulatory Visit: Payer: Self-pay | Admitting: *Deleted

## 2013-02-18 MED ORDER — ACEBUTOLOL HCL 200 MG PO CAPS: 200.0000 mg | ORAL_CAPSULE | Freq: Two times a day (BID) | ORAL | Status: DC

## 2013-04-28 ENCOUNTER — Telehealth: Payer: Self-pay | Admitting: Cardiology

## 2013-04-28 NOTE — Telephone Encounter (Signed)
Thank you for update, I agree with your documentation. PCP may set him up with us if feels cardiac issue, from history does not sound cardiac.
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
## 2013-06-20 ENCOUNTER — Ambulatory Visit (INDEPENDENT_AMBULATORY_CARE_PROVIDER_SITE_OTHER): Payer: Medicare Other | Admitting: Cardiology

## 2013-06-20 ENCOUNTER — Encounter: Payer: Self-pay | Admitting: Cardiology

## 2013-06-20 VITALS — BP 127/72 | HR 65 | Ht 71.0 in | Wt 291.0 lb

## 2013-06-20 DIAGNOSIS — Z79899 Other long term (current) drug therapy: Secondary | ICD-10-CM

## 2013-06-20 DIAGNOSIS — I10 Essential (primary) hypertension: Secondary | ICD-10-CM

## 2013-06-20 DIAGNOSIS — R404 Transient alteration of awareness: Secondary | ICD-10-CM

## 2013-06-20 DIAGNOSIS — R400 Somnolence: Secondary | ICD-10-CM

## 2013-06-20 DIAGNOSIS — R002 Palpitations: Secondary | ICD-10-CM

## 2013-06-20 NOTE — Patient Instructions (Signed)
   Referral to Dr. Andrey CampanileWilson for sleep study Continue all current medications. Labs for BMET, CBC, TSH, FLP  Office will contact with results via phone or letter.   Follow up in  1 month

## 2013-06-20 NOTE — Progress Notes (Signed)
Clinical Summary Mr. [name] is a 48 y.o.male last seen by PA Serpe, this is our first visit together. He is seen for the following medical problems.   1. Palpitations - per notes, previously noted PVCs, PACs, and brief run of atach by monitoring. Also noted 3 second pause in early 4AM hour.  - previously started on acebutolol with good control of symptoms - reports increase in frequency of palpitations over the last few weeks. Occuring approx 1-2 times a day, lasting approx 1 minute. Can also have random episodes of SOB that occur at rest, not neccesarily asssociated with palps.   2. HTN - compliant with meds    Past Medical History  Diagnosis Date  . Gout   . Cardiomegaly     Seen on chest x-ray  . Hypothyroidism   . Hyperlipidemia   . Nephritis   . Hypertension      No Known Allergies   Current Outpatient Prescriptions  Medication Sig Dispense Refill  . acebutolol (SECTRAL) 200 MG capsule Take 1 capsule (200 mg total) by mouth 2 (two) times daily.  60 capsule  0  . amLODipine (NORVASC) 5 MG tablet Take 1 tablet (5 mg total) by mouth daily.  30 tablet  6  . aspirin 81 MG tablet Take 81 mg by mouth daily.      Marland Kitchen. HYDROcodone-acetaminophen (NORCO/VICODIN) 5-325 MG per tablet Take 1-2 tablets by mouth every 4 (four) hours as needed for pain.  16 tablet  0  . ibuprofen (ADVIL,MOTRIN) 200 MG tablet Take 200 mg by mouth every 6 (six) hours as needed for pain.      Marland Kitchen. levothyroxine (SYNTHROID, LEVOTHROID) 100 MCG tablet Take 100 mcg by mouth daily before breakfast.      . lisinopril (PRINIVIL,ZESTRIL) 20 MG tablet Take 1 tablet (20 mg total) by mouth 2 (two) times daily.  60 tablet  6  . lovastatin (MEVACOR) 20 MG tablet Take 40 mg by mouth at bedtime.       No current facility-administered medications for this visit.     Past Surgical History  Procedure Laterality Date  . Left foot surgery      Multiple     No Known Allergies    Family History  Problem Relation  Age of Onset  . Heart attack      Paternal and Maternal grandmother  . Hypertension      Several family member     Social History Mr. [name] reports that he has never smoked. He has never used smokeless tobacco. Mr. [name] reports that he does not drink alcohol.   Review of Systems CONSTITUTIONAL: No weight loss, fever, chills, weakness or fatigue.  HEENT: Eyes: No visual loss, blurred vision, double vision or yellow sclerae.No hearing loss, sneezing, congestion, runny nose or sore throat.  SKIN: No rash or itching.  CARDIOVASCULAR: per HPI RESPIRATORY: SOB.  GASTROINTESTINAL: No anorexia, nausea, vomiting or diarrhea. No abdominal pain or blood.  GENITOURINARY: No burning on urination, no polyuria NEUROLOGICAL: No headache, dizziness, syncope, paralysis, ataxia, numbness or tingling in the extremities. No change in bowel or bladder control.  MUSCULOSKELETAL: No muscle, back pain, joint pain or stiffness.  LYMPHATICS: No enlarged nodes. No history of splenectomy.  PSYCHIATRIC: No history of depression or anxiety.  ENDOCRINOLOGIC: No reports of sweating, cold or heat intolerance. No polyuria or polydipsia.  Marland Kitchen.   Physical Examination p 65 bp 127/72 Wt 291 lbs BMI 41 Gen: resting comfortably, no acute distress HEENT: no  scleral icterus, pupils equal round and reactive, no palptable cervical adenopathy,  CV: RRR, no m/r/g no JVD, no carotid bruits Resp: Clear to auscultation bilaterally GI: abdomen is soft, non-tender, non-distended, normal bowel sounds, no hepatosplenomegaly MSK: extremities are warm, no edema.  Skin: warm, no rash Neuro:  no focal deficits Psych: appropriate affect   Diagnostic Studies  08/2012 DSE Baseline:  - LV, RV, and aortic size were normal. LA size was upper normal. AV and MV were normal. - LV global systolic function was normal. The estimated LV ejection fraction was 60%. - Normal wall motion; no LV regional wall motion abnormalities. Low  dose:  - LV size was normal and appropriately decreased from the prior stage. - LV global systolic function was hyperdynamic and appropriately augmented from the prior stage. The estimated LV ejection fraction was 70%. - Normal wall motion; no LV regional wall motion abnormalities. - No evidence for new LV regional wall motion abnormalities. Peak stress:  - LV size was normal, appropriately decreased from the prior stage, and appropriately decreased from baseline. - LV global systolic function was hyperdynamic, appropriately augmented from the prior stage, and appropriately augmented from baseline. The estimated LV ejection fraction was 75%. - There is no diffuse LV hypokinesis. - No evidence for new LV regional wall motion abnormalities.  ------------------------------------------------------------ Stress echo results: Left ventricular ejection fraction was normal at rest and with stress. There was no echocardiographic evidence for stress-induced ischemia.    Assessment and Plan  1. Palpitations - previously controlled with acebutolol, now with increasing symptoms - repeat BMET, if stable or improved renal function will increase dose. If worst, will change to metoprolol - pending response to increased medical therapy, may need repeat monitor  2. HTN - at goal, continue current meds  3. OSA? + snoring, BMI 41 (severe obesity), some daytime somnolence, HTN, and cardiac arrhythmia. All are associated with possible OSA - refer for sleep study  F/u 1 month  Antoine PocheJonathan F. Briya Lookabaugh, M.D., F.A.C.C.

## 2013-07-05 ENCOUNTER — Telehealth: Payer: Self-pay | Admitting: Cardiology

## 2013-07-05 NOTE — Telephone Encounter (Signed)
Pt called and said he was going in AM to have lab work completed.

## 2013-07-05 NOTE — Telephone Encounter (Signed)
LM for pt to returncall

## 2013-07-06 ENCOUNTER — Other Ambulatory Visit: Payer: Self-pay | Admitting: Cardiology

## 2013-07-06 LAB — LIPID PANEL
CHOL/HDL RATIO: 5.6 ratio
CHOLESTEROL: 196 mg/dL (ref 0–200)
HDL: 35 mg/dL — AB (ref >39)
LDL Cholesterol: 120 mg/dL — ABNORMAL HIGH (ref 0–99)
TRIGLYCERIDES: 205 mg/dL — AB (ref <150)
VLDL: 41 mg/dL — AB (ref 0–40)

## 2013-07-06 LAB — BASIC METABOLIC PANEL
BUN: 18 mg/dL (ref 6–23)
CALCIUM: 9.3 mg/dL (ref 8.4–10.5)
CO2: 28 mEq/L (ref 19–32)
Chloride: 101 mEq/L (ref 96–112)
Creat: 1.01 mg/dL (ref 0.50–1.35)
Glucose, Bld: 113 mg/dL — ABNORMAL HIGH (ref 70–99)
POTASSIUM: 4.3 meq/L (ref 3.5–5.3)
SODIUM: 137 meq/L (ref 135–145)

## 2013-07-06 LAB — CBC
HCT: 41.5 % (ref 39.0–52.0)
HEMOGLOBIN: 15 g/dL (ref 13.0–17.0)
MCH: 31.1 pg (ref 26.0–34.0)
MCHC: 36.1 g/dL — AB (ref 30.0–36.0)
MCV: 86.1 fL (ref 78.0–100.0)
PLATELETS: 250 10*3/uL (ref 150–400)
RBC: 4.82 MIL/uL (ref 4.22–5.81)
RDW: 14 % (ref 11.5–15.5)
WBC: 6.4 10*3/uL (ref 4.0–10.5)

## 2013-07-06 LAB — TSH: TSH: 3.211 u[IU]/mL (ref 0.350–4.500)

## 2013-07-15 ENCOUNTER — Encounter: Payer: Self-pay | Admitting: *Deleted

## 2013-07-27 ENCOUNTER — Encounter: Payer: Self-pay | Admitting: Cardiology

## 2013-07-27 ENCOUNTER — Encounter: Payer: Self-pay | Admitting: *Deleted

## 2013-07-27 ENCOUNTER — Ambulatory Visit (INDEPENDENT_AMBULATORY_CARE_PROVIDER_SITE_OTHER): Payer: Medicare Other | Admitting: Cardiology

## 2013-07-27 VITALS — BP 110/72 | HR 63 | Ht 71.0 in | Wt 289.8 lb

## 2013-07-27 DIAGNOSIS — I10 Essential (primary) hypertension: Secondary | ICD-10-CM

## 2013-07-27 DIAGNOSIS — R079 Chest pain, unspecified: Secondary | ICD-10-CM

## 2013-07-27 DIAGNOSIS — R002 Palpitations: Secondary | ICD-10-CM

## 2013-07-27 MED ORDER — ACEBUTOLOL HCL 200 MG PO CAPS: ORAL_CAPSULE | ORAL | Status: DC

## 2013-07-27 NOTE — Patient Instructions (Signed)
   Increase Acebutolol to 400mg  every morning & 200mg  every evening Continue all other medications.   Your physician has requested that you have a lexiscan myoview. For further information please visit [email or web address]. Please follow instruction sheet, as given. Office will contact with results via phone or letter.   Follow up in  2 month

## 2013-07-27 NOTE — Progress Notes (Signed)
Clinical Summary David Crosby is a 48 y.o.male seen today for follow up of the following medical problems.   1. Palpitations  - per notes, previously noted PVCs, PACs, and brief run of atach by monitoring. Also noted 3 second pause in early 4AM hour.  - previously started on acebutolol with initial good control of symptoms  - reports increase in frequency of palpitations over the last few weeks. Occuring approx 1-2 times a day, lasting approx 1 minute. Can also have random episodes of SOB that occur at rest, not neccesarily asssociated with palps.  - recent TSH 3.2 on 07/06/13  2. Chest pain - started approx 1 month. Dull pain in midchest, 5/10. Never had pain like this before. Occurs at rest or with exertion. Nothing makes better or worst. No assoc SOB, no N/V. Lasts approx 1 minute. Has had 4 episodes over the last few weeks.  - 08/2012 DSE negative.  CAD risk factors: HTN, HL, maternal grandmother MI in 6640s, paternal grandmother MI in 6350s.    3. HTN  - compliant with meds  4. OSA? Sleep study pending Past Medical History  Diagnosis Date  . Gout   . Cardiomegaly     Seen on chest x-ray  . Hypothyroidism   . Hyperlipidemia   . Nephritis   . Hypertension      No Known Allergies   Current Outpatient Prescriptions  Medication Sig Dispense Refill  . acebutolol (SECTRAL) 200 MG capsule Take 1 capsule (200 mg total) by mouth 2 (two) times daily.  60 capsule  0  . amLODipine (NORVASC) 10 MG tablet Take 10 mg by mouth daily.      Marland Kitchen. aspirin 81 MG tablet Take 81 mg by mouth daily.      . Febuxostat (ULORIC) 80 MG TABS Take 1 tablet by mouth as needed.      Marland Kitchen. HYDROcodone-acetaminophen (VICODIN ES) 7.5-750 MG per tablet Take 1 tablet by mouth every 6 (six) hours as needed for pain.      . indomethacin (INDOCIN) 50 MG capsule Take 50 mg by mouth 2 (two) times daily as needed.      Marland Kitchen. levothyroxine (SYNTHROID, LEVOTHROID) 100 MCG tablet Take 100 mcg by mouth daily before breakfast.       . lisinopril (PRINIVIL,ZESTRIL) 20 MG tablet Take 1 tablet (20 mg total) by mouth 2 (two) times daily.  60 tablet  6  . lovastatin (MEVACOR) 20 MG tablet Take 40 mg by mouth at bedtime.       No current facility-administered medications for this visit.     Past Surgical History  Procedure Laterality Date  . Left foot surgery      Multiple     No Known Allergies    Family History  Problem Relation Age of Onset  . Heart attack      Paternal and Maternal grandmother  . Hypertension      Several family member     Social History David Crosby reports that he has never smoked. He has never used smokeless tobacco. David Crosby reports that he does not drink alcohol.   Review of Systems CONSTITUTIONAL: No weight loss, fever, chills, weakness or fatigue.  HEENT: Eyes: No visual loss, blurred vision, double vision or yellow sclerae.No hearing loss, sneezing, congestion, runny nose or sore throat.  SKIN: No rash or itching.  CARDIOVASCULAR: per HPI RESPIRATORY: No shortness of breath, cough or sputum.  GASTROINTESTINAL: No anorexia, nausea, vomiting or diarrhea. No abdominal pain  or blood.  GENITOURINARY: No burning on urination, no polyuria NEUROLOGICAL: No headache, dizziness, syncope, paralysis, ataxia, numbness or tingling in the extremities. No change in bowel or bladder control.  MUSCULOSKELETAL: No muscle, back pain, joint pain or stiffness.  LYMPHATICS: No enlarged nodes. No history of splenectomy.  PSYCHIATRIC: No history of depression or anxiety.  ENDOCRINOLOGIC: No reports of sweating, cold or heat intolerance. No polyuria or polydipsia.  Marland Kitchen.   Physical Examination p 63 bp 110/72 Wt 289 lbs BMI 40 Gen: resting comfortably, no acute distress HEENT: no scleral icterus, pupils equal round and reactive, no palptable cervical adenopathy,  CV: RRR, no m/r/g, no JVD, no carotid bruits Resp: Clear to auscultation bilaterally GI: abdomen is soft, non-tender, non-distended,  normal bowel sounds, no hepatosplenomegaly MSK: extremities are warm, no edema.  Skin: warm, no rash Neuro:  no focal deficits Psych: appropriate affect   Diagnostic Studies  08/2012 DSE  Baseline:  - LV, RV, and aortic size were normal. LA size was upper normal. AV and MV were normal. - LV global systolic function was normal. The estimated LV ejection fraction was 60%. - Normal wall motion; no LV regional wall motion abnormalities. Low dose:  - LV size was normal and appropriately decreased from the prior stage. - LV global systolic function was hyperdynamic and appropriately augmented from the prior stage. The estimated LV ejection fraction was 70%. - Normal wall motion; no LV regional wall motion abnormalities. - No evidence for new LV regional wall motion abnormalities. Peak stress:  - LV size was normal, appropriately decreased from the prior stage, and appropriately decreased from baseline. - LV global systolic function was hyperdynamic, appropriately augmented from the prior stage, and appropriately augmented from baseline. The estimated LV ejection fraction was 75%. - There is no diffuse LV hypokinesis. - No evidence for new LV regional wall motion abnormalities.  ------------------------------------------------------------ Stress echo results: Left ventricular ejection fraction was normal at rest and with stress. There was no echocardiographic evidence for stress-induced ischemia.   Pertinent labs 07/06/13 K 4.3 Cr 1.01 BUN 18  Assessment and Plan   1. Palpitations  - previously controlled with acebutolol, now with increasing symptoms  - will increase acebutolol to 400mg  in AM, continue 200mg  at night as his prior cardiac monitor showed signififcant nighttime bradycardia  2. Chest pain - unclear etiology, he has several CAD risk factors - he cannot run on a treadmill due to chronic leg pain with multiple surgeries - will obtain Lexiscan.   3. HTN  -  at goal, continue current meds   4. OSA?  + snoring, BMI 41 (severe obesity), some daytime somnolence, HTN, and cardiac arrhythmia. All are associated with possible OSA  - awaiting sleep study   F/u 2 months  Antoine PocheJonathan F. Branch, M.D., F.A.C.C.

## 2013-08-04 ENCOUNTER — Encounter (HOSPITAL_COMMUNITY): Payer: Self-pay

## 2013-08-04 ENCOUNTER — Encounter (HOSPITAL_COMMUNITY)
Admission: RE | Admit: 2013-08-04 | Discharge: 2013-08-04 | Disposition: A | Discharge status: Home / Self Care | Payer: Medicare Other | Source: Ambulatory Visit | Attending: Cardiology | Admitting: Cardiology

## 2013-08-04 ENCOUNTER — Ambulatory Visit (HOSPITAL_COMMUNITY)
Admission: RE | Admit: 2013-08-04 | Discharge: 2013-08-04 | Disposition: A | Discharge status: Home / Self Care | Payer: Medicare Other | Source: Ambulatory Visit | Attending: Cardiology | Admitting: Cardiology

## 2013-08-04 DIAGNOSIS — R002 Palpitations: Secondary | ICD-10-CM | POA: Insufficient documentation

## 2013-08-04 DIAGNOSIS — R079 Chest pain, unspecified: Secondary | ICD-10-CM

## 2013-08-04 HISTORY — DX: Disorder of kidney and ureter, unspecified: N28.9

## 2013-08-04 HISTORY — DX: Unspecified asthma, uncomplicated: J45.909

## 2013-08-04 MED ORDER — TECHNETIUM TC 99M SESTAMIBI GENERIC - CARDIOLITE
30.0000 | Freq: Once | INTRAVENOUS | Status: AC | PRN
  Administered 2013-08-04: 30 via INTRAVENOUS

## 2013-08-04 MED ORDER — SODIUM CHLORIDE 0.9 % IJ SOLN
INTRAMUSCULAR | Status: AC
  Administered 2013-08-04: 10 mL via INTRAVENOUS
  Filled 2013-08-04: qty 10

## 2013-08-04 MED ORDER — TECHNETIUM TC 99M SESTAMIBI - CARDIOLITE
10.0000 | Freq: Once | INTRAVENOUS | Status: AC | PRN
  Administered 2013-08-04: 09:00:00 10 via INTRAVENOUS

## 2013-08-04 MED ORDER — REGADENOSON 0.4 MG/5ML IV SOLN
INTRAVENOUS | Status: AC
  Administered 2013-08-04: 0.4 mg via INTRAVENOUS
  Filled 2013-08-04: qty 5

## 2013-08-04 NOTE — Progress Notes (Signed)
Stress Lab Nurses Notes - [facility]  JEFFIE WOOLFORD 08/04/2013 Reason for doing test: Chest Pain and palpitation Type of test: Marlane Hatcher Nurse performing test: Parke Poisson, RN Nuclear Medicine Tech: Lyndel Pleasure Echo Tech: Not Applicable MD performing test: Ival Bible /K.Lyman Bishop NP Family MD: Samuel Jester, DO Test explained and consent signed: yes IV started: 22g jelco, Saline lock flushed, No redness or edema and Saline lock started in radiology Symptoms: Chest tightness & pressure Treatment/Intervention: None Reason test stopped: protocol completed After recovery IV was: Discontinued via X-ray tech and No redness or edema Patient to return to Nuc. Med at : 11:05 Patient discharged: Home Patient's Condition upon discharge was: stable Comments: During test BP 110/77 & HR 78.  Recovery BP 118/75 & HR 73.  Symptoms resolved in recovery. Tawni Millers

## 2013-08-15 ENCOUNTER — Telehealth: Payer: Self-pay | Admitting: *Deleted

## 2013-08-15 NOTE — Telephone Encounter (Signed)
Notes Recorded by Lesle Chris, LPN on 08/09/353 at 1:38 PM Patient notified and verbalized understanding. Follow up scheduled for 09/29/2013 with Dr. Wyline Mood.

## 2013-08-15 NOTE — Telephone Encounter (Signed)
Message copied by Lesle Chris on Mon Aug 15, 2013  1:39 PM ------      Message from: Jodelle Gross      Created: Thu Aug 04, 2013  4:39 PM       Test results reviewed. No evidence of ischemia. No changes in medication regimen ------

## 2013-09-29 ENCOUNTER — Ambulatory Visit (INDEPENDENT_AMBULATORY_CARE_PROVIDER_SITE_OTHER): Payer: Medicare Other | Admitting: Cardiology

## 2013-09-29 ENCOUNTER — Encounter: Payer: Self-pay | Admitting: Cardiology

## 2013-09-29 VITALS — BP 113/71 | HR 62 | Ht 71.0 in | Wt 262.0 lb

## 2013-09-29 DIAGNOSIS — R400 Somnolence: Secondary | ICD-10-CM

## 2013-09-29 DIAGNOSIS — E785 Hyperlipidemia, unspecified: Secondary | ICD-10-CM

## 2013-09-29 DIAGNOSIS — R404 Transient alteration of awareness: Secondary | ICD-10-CM

## 2013-09-29 DIAGNOSIS — R002 Palpitations: Secondary | ICD-10-CM

## 2013-09-29 DIAGNOSIS — R0789 Other chest pain: Secondary | ICD-10-CM

## 2013-09-29 DIAGNOSIS — I10 Essential (primary) hypertension: Secondary | ICD-10-CM

## 2013-09-29 MED ORDER — RANITIDINE HCL 150 MG PO TABS: 150.0000 mg | ORAL_TABLET | Freq: Two times a day (BID) | ORAL | Status: DC

## 2013-09-29 MED ORDER — AMLODIPINE BESYLATE 5 MG PO TABS: 5.0000 mg | ORAL_TABLET | Freq: Every day | ORAL | Status: DC

## 2013-09-29 NOTE — Patient Instructions (Signed)
   Decrease Amlodipine to 5mg  daily - printed script provided today  Begin Zantac 150mg  twice a day  - may buy OTC Continue all other medications.   Referral to Dr. Andrey CampanileWilson for somnolence Please call office in about 2 weeks for update on dizziness Your physician wants you to follow up in:  1 year.  You will receive a reminder letter in the mail one-two months in advance.  If you don't receive a letter, please call our office to schedule the follow up appointment

## 2013-09-29 NOTE — Progress Notes (Signed)
Clinical Summary David Crosby is a 48 y.o.male seen today for follow up of the following medical problems.   1. Palpitations  - per notes, previously noted PVCs, PACs, and brief run of atach by monitoring. Also noted 3 second pause in early 4AM hour.  - previously started on acebutolol with initial good control of symptoms -  Last visit reported increased symptoms,increased AM acetbutolol to 400mg  daily, kept evening dose at 200mg  as his prior event monitor showed some nighttime bradycardia  - since last visit still with some symptoms, occurs every few days. Lasts approx 1 minute or so. Overall fairly mild   2. Chest pain  - last visit described approx 1 month of dull pain in midchest, 5/10. Never had pain like this before. Occurs at rest or with exertion. Nothing makes better or worst. No assoc SOB, no N/V. Lasts approx 1 minute.  - 08/2012 DSE negative.  - referred again for stress testing due to new symptoms, MPI 07/2013 low risk, no specific ischemia. Likely subdiaphragmatic attenuation.   3. HTN  - compliant with meds  - does not check regluarly - reports some occasional orthostatic symptoms with standing, dizziness  4. OSA?  Sleep study pending, he states he was not scheduled with sleep medicine since last visit  5. Hyperlipidemia - compliant with statin  6. Weight loss - reports unintential weight loss over last several months, approx 30 lbs Past Medical History  Diagnosis Date  . Gout   . Cardiomegaly     Seen on chest x-ray  . Hypothyroidism   . Hyperlipidemia   . Nephritis   . Hypertension   . Renal insufficiency   . Asthma      No Known Allergies   Current Outpatient Prescriptions  Medication Sig Dispense Refill  . acebutolol (SECTRAL) 200 MG capsule Take 400mg  (2 caps) by mouth every morning & 200mg  (1 tab) every evening  90 capsule  6  . amLODipine (NORVASC) 10 MG tablet Take 10 mg by mouth daily.      Marland Kitchen. aspirin 81 MG tablet Take 81 mg by mouth daily.       . febuxostat (ULORIC) 40 MG tablet Take 40 mg by mouth every evening.      . Febuxostat (ULORIC) 80 MG TABS Take 1 tablet by mouth every morning.       Marland Kitchen. HYDROcodone-acetaminophen (VICODIN ES) 7.5-750 MG per tablet Take 1 tablet by mouth every 6 (six) hours as needed for pain.      Marland Kitchen. levothyroxine (SYNTHROID, LEVOTHROID) 100 MCG tablet Take 100 mcg by mouth daily before breakfast.      . lisinopril (PRINIVIL,ZESTRIL) 20 MG tablet Take 1 tablet (20 mg total) by mouth 2 (two) times daily.  60 tablet  6  . lovastatin (MEVACOR) 20 MG tablet Take 40 mg by mouth at bedtime.       No current facility-administered medications for this visit.     Past Surgical History  Procedure Laterality Date  . Left foot surgery      Multiple     No Known Allergies    Family History  Problem Relation Age of Onset  . Heart attack      Paternal and Maternal grandmother  . Hypertension      Several family member     Social History David Crosby reports that he has never smoked. He has never used smokeless tobacco. David Crosby reports that he does not drink alcohol.   Review of  Systems CONSTITUTIONAL: + weight loss HEENT: Eyes: No visual loss, blurred vision, double vision or yellow sclerae.No hearing loss, sneezing, congestion, runny nose or sore throat.  SKIN: No rash or itching.  CARDIOVASCULAR: per HPI RESPIRATORY: No shortness of breath, cough or sputum.  GASTROINTESTINAL: No anorexia, nausea, vomiting or diarrhea. No abdominal pain or blood.  GENITOURINARY: No burning on urination, no polyuria NEUROLOGICAL: occas dizziness MUSCULOSKELETAL: No muscle, back pain, joint pain or stiffness.  LYMPHATICS: No enlarged nodes. No history of splenectomy.  PSYCHIATRIC: No history of depression or anxiety.  ENDOCRINOLOGIC: No reports of sweating, cold or heat intolerance. No polyuria or polydipsia.  Marland Kitchen   Physical Examination p 62 bp 113/71 Wt 262 lbs BMI 36 Gen: resting comfortably, no acute  distress HEENT: no scleral icterus, pupils equal round and reactive, no palptable cervical adenopathy,  CV: RRR, no m/r/g, no JVD, no carotid bruits Resp: Clear to auscultation bilaterally GI: abdomen is soft, non-tender, non-distended, normal bowel sounds, no hepatosplenomegaly MSK: extremities are warm, no edema.  Skin: warm, no rash Neuro:  no focal deficits Psych: appropriate affect   Diagnostic Studies 08/2012 DSE  Baseline:  - LV, RV, and aortic size were normal. LA size was upper normal. AV and MV were normal. - LV global systolic function was normal. The estimated LV ejection fraction was 60%. - Normal wall motion; no LV regional wall motion abnormalities. Low dose:  - LV size was normal and appropriately decreased from the prior stage. - LV global systolic function was hyperdynamic and appropriately augmented from the prior stage. The estimated LV ejection fraction was 70%. - Normal wall motion; no LV regional wall motion abnormalities. - No evidence for new LV regional wall motion abnormalities. Peak stress:  - LV size was normal, appropriately decreased from the prior stage, and appropriately decreased from baseline. - LV global systolic function was hyperdynamic, appropriately augmented from the prior stage, and appropriately augmented from baseline. The estimated LV ejection fraction was 75%. - There is no diffuse LV hypokinesis. - No evidence for new LV regional wall motion abnormalities.  ------------------------------------------------------------ Stress echo results: Left ventricular ejection fraction was normal at rest and with stress. There was no echocardiographic evidence for stress-induced ischemia.  Pertinent labs 07/06/13  K 4.3 Cr 1.01 BUN 18   07/2013 MPI Tomographic views were obtained using the short axis, vertical long  axis, and horizontal long axis planes. There is a small, mild  intensity mid to basal inferolateral defect that is  predominantly  fixed with a partial degree of reversibility in the basal zone. This  likely represents variable soft tissue attenuation, cannot  completely exclude a mild degree of ischemia.  Gated imaging reveals an EDV of 142, ESV of 60, LVEF of 58%, and no  focal wall motion abnormalities.  IMPRESSION:  Overall low risk Lexiscan Cardiolite. There were no diagnostic ST  segment abnormalities or arrhythmias. Perfusion imaging is  suggestive of variable soft tissue attenuation, particularly in the  diaphragmatic distribution. Cannot completely exclude a very small  region of ischemia in the mid to basal inferolateral wall. LVEF is  normal at 58% with normal volumes and wall motion.   Assessment and Plan  1. Palpitations  - reasonable control with current doses of acebutolol, avoid further titration due to low normal heart rates, and noted night time bradycardia on previous monitor - awaiting sleep study, if potential OSA then treatment likely would improve his symptoms  2. Chest pain  - negative stress testing, does not  appear to be cardiac - will start emperic H2 blocker and follow possible GI symptoms  3. HTN  - describes some orthostatic symptoms - with his recent weight loss his bp has improved, likely does not require as high doses of meds - decrease norvasc to 5mg  daily, he will call us in 2 weeks and let us know how symptoms are. If still ongoing stop norvasc. Try to keep acebutolol at current doses given his palpitations.   4. OSA?  + snoring, BMI 41 (severe obesity), some daytime somnolence, HTN, and cardiac arrhythmia. All are associated with possible OSA  - awaiting sleep study  5. Hyperlipidemia - continue current statin  6. Weight loss - I have asked him to discuss this with his pcp   F/u 1 year Antoine Poche, M.D., F.A.C.C.

## 2014-05-09 ENCOUNTER — Telehealth: Payer: Self-pay | Admitting: *Deleted

## 2014-05-09 NOTE — Telephone Encounter (Signed)
Pt will come to office at 9:40 AM.

## 2014-05-09 NOTE — Telephone Encounter (Signed)
Can add him on tomorrow at 940 if he can make it. If not then earliest available that I have, 940s work on other days too  Dominga FerryJ Simara Rhyner MD

## 2014-05-09 NOTE — Telephone Encounter (Signed)
Pt is c/o SOB when walking/working. Has appt on 3/22 but wants to know if he needs appt sooner. BP and HR WNL Will forward to Dr. Wyline MoodBranch

## 2014-05-10 ENCOUNTER — Encounter: Payer: Self-pay | Admitting: Cardiology

## 2014-05-10 ENCOUNTER — Ambulatory Visit (INDEPENDENT_AMBULATORY_CARE_PROVIDER_SITE_OTHER): Payer: Medicare Other | Admitting: Cardiology

## 2014-05-10 VITALS — BP 150/94 | HR 80 | Ht 71.0 in | Wt 271.0 lb

## 2014-05-10 DIAGNOSIS — E039 Hypothyroidism, unspecified: Secondary | ICD-10-CM

## 2014-05-10 DIAGNOSIS — Z1329 Encounter for screening for other suspected endocrine disorder: Secondary | ICD-10-CM

## 2014-05-10 DIAGNOSIS — R0602 Shortness of breath: Secondary | ICD-10-CM

## 2014-05-10 MED ORDER — ALBUTEROL SULFATE HFA 108 (90 BASE) MCG/ACT IN AERS: 2.0000 | INHALATION_SPRAY | Freq: Four times a day (QID) | RESPIRATORY_TRACT | Status: DC | PRN

## 2014-05-10 NOTE — Progress Notes (Signed)
Clinical Summary Mr. Wurm is a 49 y.o.male seen today for a focused visit on new symptoms of shortness of breath.    1. SOB - ongoing for 3-4 weeks. Mainly DOE with activity. +cough, non-productive. Cough is new. Denies any recent cold like symptoms. No fevers or chills. Has noted some wheezing at times. No nasal congestion. No LE edema. No orthopnea.  - episode yesterday while carrying boxes very diaphoretic, SOB. Felt dizzy.       Past Medical History  Diagnosis Date  . Gout   . Cardiomegaly     Seen on chest x-ray  . Hypothyroidism   . Hyperlipidemia   . Nephritis   . Hypertension   . Renal insufficiency   . Asthma      No Known Allergies   Current Outpatient Prescriptions  Medication Sig Dispense Refill  . acebutolol (SECTRAL) 200 MG capsule Take  (2 caps) by mouth every morning &  (1 tab) every evening 90 capsule 6  . amLODipine (NORVASC) 5 MG tablet Take 1 tablet (5 mg total) by mouth daily. 30 tablet 11  . aspirin 81 MG tablet Take 81 mg by mouth daily.    . febuxostat (ULORIC) 40 MG tablet Take 40 mg by mouth daily. Take two  in AM one 40 in PM    . HYDROcodone-acetaminophen (VICODIN ES) 7.5-750 MG per tablet Take 1 tablet by mouth every 6 (six) hours as needed for pain.    Marland Kitchen levothyroxine (SYNTHROID, LEVOTHROID) 100 MCG tablet Take 100 mcg by mouth daily before breakfast.    . lisinopril (PRINIVIL,ZESTRIL) 20 MG tablet Take 1 tablet (20 mg total) by mouth 2 (two) times daily. 60 tablet 6  . lovastatin (MEVACOR) 20 MG tablet Take 40 mg by mouth at bedtime.    . ranitidine (ZANTAC) 150 MG tablet Take 1 tablet (150 mg total) by mouth 2 (two) times daily.     No current facility-administered medications for this visit.     Past Surgical History  Procedure Laterality Date  . Left foot surgery      Multiple     No Known Allergies    Family History  Problem Relation Age of Onset  . Heart attack      Paternal and Maternal grandmother   . Hypertension      Several family member     Social History Mr. Kirschenmann reports that he has never smoked. He has never used smokeless tobacco. Mr. Kemppainen reports that he does not drink alcohol.   Review of Systems CONSTITUTIONAL: No weight loss, fever, chills, weakness or fatigue.  HEENT: Eyes: No visual loss, blurred vision, double vision or yellow sclerae.No hearing loss, sneezing, congestion, runny nose or sore throat.  SKIN: No rash or itching.  CARDIOVASCULAR: no chest pain RESPIRATORY: per HPI  GASTROINTESTINAL: No anorexia, nausea, vomiting or diarrhea. No abdominal pain or blood.  GENITOURINARY: No burning on urination, no polyuria NEUROLOGICAL: No headache, dizziness, syncope, paralysis, ataxia, numbness or tingling in the extremities. No change in bowel or bladder control.  MUSCULOSKELETAL: No muscle, back pain, joint pain or stiffness.  LYMPHATICS: No enlarged nodes. No history of splenectomy.  PSYCHIATRIC: No history of depression or anxiety.  ENDOCRINOLOGIC: No reports of sweating, cold or heat intolerance. No polyuria or polydipsia.  Marland Kitchen   Physical Examination p 80 bp 150/94 Wt 271 lbs BMI 38 Gen: resting comfortably, no acute distress HEENT: no scleral icterus, pupils equal round and reactive, no palptable cervical adenopathy,  CV: RRR, no m/r/g, no JVD Resp: Clear to auscultation bilaterally GI: abdomen is soft, non-tender, non-distended, normal bowel sounds, no hepatosplenomegaly MSK: extremities are warm, no edema.  Skin: warm, no rash Neuro:  no focal deficits Psych: appropriate affect   Diagnostic Studies 08/2012 DSE  Baseline:  - LV, RV, and aortic size were normal. LA size was upper normal. AV and MV were normal. - LV global systolic function was normal. The estimated LV ejection fraction was 60%. - Normal wall motion; no LV regional wall motion abnormalities. Low dose:  - LV size was normal and appropriately decreased from the prior stage. -  LV global systolic function was hyperdynamic and appropriately augmented from the prior stage. The estimated LV ejection fraction was 70%. - Normal wall motion; no LV regional wall motion abnormalities. - No evidence for new LV regional wall motion abnormalities. Peak stress:  - LV size was normal, appropriately decreased from the prior stage, and appropriately decreased from baseline. - LV global systolic function was hyperdynamic, appropriately augmented from the prior stage, and appropriately augmented from baseline. The estimated LV ejection fraction was 75%. - There is no diffuse LV hypokinesis. - No evidence for new LV regional wall motion abnormalities.  ------------------------------------------------------------ Stress echo results: Left ventricular ejection fraction was normal at rest and with stress. There was no echocardiographic evidence for stress-induced ischemia.  Pertinent labs 07/06/13  K 4.3 Cr 1.01 BUN 18   07/2013 MPI Tomographic views were obtained using the short axis, vertical long  axis, and horizontal long axis planes. There is a small, mild  intensity mid to basal inferolateral defect that is predominantly  fixed with a partial degree of reversibility in the basal zone. This  likely represents variable soft tissue attenuation, cannot  completely exclude a mild degree of ischemia.  Gated imaging reveals an EDV of 142, ESV of 60, LVEF of 58%, and no  focal wall motion abnormalities.  IMPRESSION:  Overall low risk Lexiscan Cardiolite. There were no diagnostic ST  segment abnormalities or arrhythmias. Perfusion imaging is  suggestive of variable soft tissue attenuation, particularly in the  diaphragmatic distribution. Cannot completely exclude a very small  region of ischemia in the mid to basal inferolateral wall. LVEF is  normal at 58% with normal volumes and wall motion.    Assessment and Plan   1. SOB - unclear etiology. Will  check CXR along with labs including a BNP       Antoine PocheJonathan F. Camyah Pultz, M.D.

## 2014-05-10 NOTE — Patient Instructions (Signed)
Your physician recommends that you schedule a follow-up appointment in: 4-6 WEEKS WITH DR. BRANCH  Your physician recommends that you continue on your current medications as directed. Please refer to the Current Medication list given to you today.  PLEASE USE INHALER AS DIRECTED   Your physician recommends that you return for lab work CBC/BNP/BMP/TSH  A chest x-ray takes a picture of the organs and structures inside the chest, including the heart, lungs, and blood vessels. This test can show several things, including, whether the heart is enlarges; whether fluid is building up in the lungs; and whether pacemaker / defibrillator leads are still in place.  Thank you for choosing [facility]!!

## 2014-05-11 ENCOUNTER — Other Ambulatory Visit: Payer: Self-pay | Admitting: Cardiology

## 2014-05-11 ENCOUNTER — Ambulatory Visit (HOSPITAL_COMMUNITY)
Admission: RE | Admit: 2014-05-11 | Discharge: 2014-05-11 | Disposition: A | Discharge status: Home / Self Care | Payer: Medicare Other | Source: Ambulatory Visit | Attending: Cardiology | Admitting: Cardiology

## 2014-05-11 DIAGNOSIS — R0602 Shortness of breath: Secondary | ICD-10-CM | POA: Insufficient documentation

## 2014-05-11 DIAGNOSIS — I10 Essential (primary) hypertension: Secondary | ICD-10-CM | POA: Insufficient documentation

## 2014-05-11 DIAGNOSIS — R05 Cough: Secondary | ICD-10-CM | POA: Insufficient documentation

## 2014-05-11 DIAGNOSIS — I517 Cardiomegaly: Secondary | ICD-10-CM | POA: Diagnosis not present

## 2014-05-11 DIAGNOSIS — R0789 Other chest pain: Secondary | ICD-10-CM | POA: Insufficient documentation

## 2014-05-11 LAB — CBC
HCT: 45.7 % (ref 39.0–52.0)
Hemoglobin: 15.7 g/dL (ref 13.0–17.0)
MCH: 30.3 pg (ref 26.0–34.0)
MCHC: 34.4 g/dL (ref 30.0–36.0)
MCV: 88.2 fL (ref 78.0–100.0)
MPV: 9.3 fL (ref 8.6–12.4)
Platelets: 239 10*3/uL (ref 150–400)
RBC: 5.18 MIL/uL (ref 4.22–5.81)
RDW: 13.4 % (ref 11.5–15.5)
WBC: 5.8 10*3/uL (ref 4.0–10.5)

## 2014-05-11 LAB — BASIC METABOLIC PANEL
BUN: 16 mg/dL (ref 6–23)
CALCIUM: 9.4 mg/dL (ref 8.4–10.5)
CO2: 26 meq/L (ref 19–32)
Chloride: 105 mEq/L (ref 96–112)
Creat: 1.1 mg/dL (ref 0.50–1.35)
Glucose, Bld: 148 mg/dL — ABNORMAL HIGH (ref 70–99)
Potassium: 4.1 mEq/L (ref 3.5–5.3)
Sodium: 140 mEq/L (ref 135–145)

## 2014-05-11 LAB — TSH: TSH: 5.807 u[IU]/mL — ABNORMAL HIGH (ref 0.350–4.500)

## 2014-05-12 LAB — BRAIN NATRIURETIC PEPTIDE: Brain Natriuretic Peptide: 6.6 pg/mL (ref 0.0–100.0)

## 2014-05-13 LAB — T3: T3, Total: 124.9 ng/dL (ref 80.0–204.0)

## 2014-05-13 LAB — T4, FREE: Free T4: 0.88 ng/dL (ref 0.80–1.80)

## 2014-05-15 ENCOUNTER — Telehealth: Payer: Self-pay | Admitting: *Deleted

## 2014-05-15 NOTE — Telephone Encounter (Signed)
-----   Message from Antoine PocheJonathan F Branch, MD sent at 05/15/2014 12:53 PM EST ----- Follow thyroid studies came back normal. Please forward to pcp  Dominga FerryJ Branch MD

## 2014-05-15 NOTE — Telephone Encounter (Signed)
Forwarded to Dr. Charm BargesButler. Await T3 and Free T4 results. Lm for pt to call back

## 2014-05-15 NOTE — Telephone Encounter (Signed)
Pt returned call made aware of results and pt will f/u with Dr. Charm BargesButler. Forwarded results

## 2014-05-15 NOTE — Telephone Encounter (Signed)
-----   Message from Antoine PocheJonathan F Branch, MD sent at 05/12/2014 12:31 PM EST ----- CXR is clear, there is no pneumonia or fluid. Labs overall look good, however his thyroid test is a little high, please add on a T3 and a FREE T4. Nothing to explain his cough and SOB, I would ask he follow up with his pcp for further evaluation, no evidence of heart cause. Please forward these results to his pcp  Avery DennisonJ Branch

## 2014-05-30 ENCOUNTER — Ambulatory Visit: Payer: Medicare Other | Admitting: Cardiology

## 2014-06-02 ENCOUNTER — Encounter: Payer: Self-pay | Admitting: Cardiology

## 2014-06-02 ENCOUNTER — Ambulatory Visit (INDEPENDENT_AMBULATORY_CARE_PROVIDER_SITE_OTHER): Payer: Medicare Other | Admitting: Cardiology

## 2014-06-02 VITALS — BP 156/93 | HR 72 | Ht 71.0 in | Wt 275.0 lb

## 2014-06-02 DIAGNOSIS — R059 Cough, unspecified: Secondary | ICD-10-CM

## 2014-06-02 DIAGNOSIS — R0602 Shortness of breath: Secondary | ICD-10-CM

## 2014-06-02 DIAGNOSIS — R05 Cough: Secondary | ICD-10-CM | POA: Diagnosis not present

## 2014-06-02 MED ORDER — AZITHROMYCIN 500 MG PO TABS: ORAL_TABLET | ORAL | Status: DC

## 2014-06-02 MED ORDER — PREDNISONE 20 MG PO TABS
20.0000 mg | ORAL_TABLET | Freq: Every day | ORAL | Status: DC
Start: 2014-06-02 — End: 2014-12-11

## 2014-06-02 NOTE — Progress Notes (Signed)
Clinical Summary David Crosby is a 49 y.o.male seen today for follow up of the following medical problems. This is a focused visit on recent cough and SOB, for full medical history please review to prior clinic notes.   1. SOB - last visit described history of worsening cough and SOB. No fevers or chills.   - sent for CXR which showed no acute process. Labs were normal including a BNP of 6.  - still with SOB today. Cough can get so heavy that he gets lightheaded and dizzy. Given prn albuterol last visit however not much benefit.       Past Medical History  Diagnosis Date  . Gout   . Cardiomegaly     Seen on chest x-ray  . Hypothyroidism   . Hyperlipidemia   . Nephritis   . Hypertension   . Renal insufficiency   . Asthma      No Known Allergies   Current Outpatient Prescriptions  Medication Sig Dispense Refill  . acebutolol (SECTRAL) 200 MG capsule Take  (2 caps) by mouth every morning &  (1 tab) every evening 90 capsule 6  . albuterol (PROVENTIL HFA;VENTOLIN HFA) 108 (90 BASE) MCG/ACT inhaler Inhale 2 puffs into the lungs every 6 (six) hours as needed for wheezing or shortness of breath. 1 Inhaler 2  . amLODipine (NORVASC) 10 MG tablet Take 10 mg by mouth daily.    Marland Kitchen aspirin 81 MG tablet Take 81 mg by mouth daily.    . febuxostat (ULORIC) 40 MG tablet Take 40 mg by mouth 2 (two) times daily.     Marland Kitchen HYDROcodone-acetaminophen (NORCO) 10-325 MG per tablet Take 1 tablet by mouth every 6 (six) hours as needed.    . indomethacin (INDOCIN) 50 MG capsule Take 50 mg by mouth as needed.    Marland Kitchen levothyroxine (SYNTHROID, LEVOTHROID) 100 MCG tablet Take 100 mcg by mouth daily before breakfast.    . lisinopril (PRINIVIL,ZESTRIL) 20 MG tablet Take 1 tablet (20 mg total) by mouth 2 (two) times daily. (Patient taking differently: Take 20 mg by mouth daily. ) 60 tablet 6  . lovastatin (MEVACOR) 20 MG tablet Take 40 mg by mouth at bedtime.     No current facility-administered  medications for this visit.     Past Surgical History  Procedure Laterality Date  . Left foot surgery      Multiple     No Known Allergies    Family History  Problem Relation Age of Onset  . Heart attack      Paternal and Maternal grandmother  . Hypertension      Several family member     Social History David Crosby reports that he has never smoked. He has never used smokeless tobacco. David Crosby reports that he does not drink alcohol.   Review of Systems CONSTITUTIONAL: No weight loss, fever, chills, weakness or fatigue.  HEENT: Eyes: No visual loss, blurred vision, double vision or yellow sclerae.No hearing loss, sneezing, congestion, runny nose or sore throat.  SKIN: No rash or itching.  CARDIOVASCULAR: no chest pain, no palpitiatons RESPIRATORY: per HPI  GASTROINTESTINAL: No anorexia, nausea, vomiting or diarrhea. No abdominal pain or blood.  GENITOURINARY: No burning on urination, no polyuria NEUROLOGICAL: No headache, dizziness, syncope, paralysis, ataxia, numbness or tingling in the extremities. No change in bowel or bladder control.  MUSCULOSKELETAL: No muscle, back pain, joint pain or stiffness.  LYMPHATICS: No enlarged nodes. No history of splenectomy.  PSYCHIATRIC: No history of  depression or anxiety.  ENDOCRINOLOGIC: No reports of sweating, cold or heat intolerance. No polyuria or polydipsia.  Marland Kitchen.   Physical Examination p 72 bp 156/93 Wt 275 lbs BMI 38 Gen: resting comfortably, no acute distress HEENT: no scleral icterus, pupils equal round and reactive, no palptable cervical adenopathy,  CV: RRR, no m/r/g, no JVD, no carotid bruits Resp: Clear to auscultation bilaterally GI: abdomen is soft, non-tender, non-distended, normal bowel sounds, no hepatosplenomegaly MSK: extremities are warm, no edema.  Skin: warm, no rash Neuro:  no focal deficits Psych: appropriate affect   Diagnostic Studies 08/2012 DSE  Baseline:  - LV, RV, and aortic size were  normal. LA size was upper normal. AV and MV were normal. - LV global systolic function was normal. The estimated LV ejection fraction was 60%. - Normal wall motion; no LV regional wall motion abnormalities. Low dose:  - LV size was normal and appropriately decreased from the prior stage. - LV global systolic function was hyperdynamic and appropriately augmented from the prior stage. The estimated LV ejection fraction was 70%. - Normal wall motion; no LV regional wall motion abnormalities. - No evidence for new LV regional wall motion abnormalities. Peak stress:  - LV size was normal, appropriately decreased from the prior stage, and appropriately decreased from baseline. - LV global systolic function was hyperdynamic, appropriately augmented from the prior stage, and appropriately augmented from baseline. The estimated LV ejection fraction was 75%. - There is no diffuse LV hypokinesis. - No evidence for new LV regional wall motion abnormalities.  ------------------------------------------------------------ Stress echo results: Left ventricular ejection fraction was normal at rest and with stress. There was no echocardiographic evidence for stress-induced ischemia.  07/2013 MPI Tomographic views were obtained using the short axis, vertical long  axis, and horizontal long axis planes. There is a small, mild  intensity mid to basal inferolateral defect that is predominantly  fixed with a partial degree of reversibility in the basal zone. This  likely represents variable soft tissue attenuation, cannot  completely exclude a mild degree of ischemia.  Gated imaging reveals an EDV of 142, ESV of 60, LVEF of 58%, and no  focal wall motion abnormalities.  IMPRESSION:  Overall low risk Lexiscan Cardiolite. There were no diagnostic ST  segment abnormalities or arrhythmias. Perfusion imaging is  suggestive of variable soft tissue attenuation, particularly in the   diaphragmatic distribution. Cannot completely exclude a very small  region of ischemia in the mid to basal inferolateral wall. LVEF is  normal at 58% with normal volumes and wall motion.  05/2014 CXR IMPRESSION: Mild cardiomegaly. There is no pneumonia, CHF, nor other acute cardiopulmonary abnormality.     Assessment and Plan  1. SOB/cough - ongoing for several weeks. CXR clear, BNP normal, no WBC, no fevers or chills - will give 5 day course of azithromycin as well as 5 day course of prednisone 40mg  daily for possible bronchitis. Asked to follow up with pcp, he states he has appt early next week      Antoine PocheJonathan F. Branch, M.D.

## 2014-06-02 NOTE — Patient Instructions (Signed)
Your physician wants you to follow-up in: 6 MONTHS WITH DR. BRANCH You will receive a reminder letter in the mail two months in advance. If you don't receive a letter, please call our office to schedule the follow-up appointment.  Your physician has recommended you make the following change in your medication:   TAKE PREDNISONE 40 MG DAILY FOR 5 DAYS  TAKE AZITHROMYCIN 500 MG ON THE 1ST DAY THEN 250 MG DAILY FOR THE NEXT 4 DAYS  CONTINUE ALL OTHER MEDICATIONS AS DIRECTED  Thank you for choosing [facility]!!

## 2014-06-09 IMAGING — NM NM MYOCAR SINGLE W/SPECT W/WALL MOTION & EF
2 series · 12 of 12 positions shown · non-contrast
Comparison: none

CLINICAL DATA: 47-year-old male with history of hypertension,
hyperlipidemia, possible OSA, and ectopy documented by cardiac
monitoring. He has had chest discomfort, and the study is requested
to evaluate for the presence of ischemia.

EXAM:
MYOCARDIAL IMAGING WITH SPECT (REST AND PHARMACOLOGIC-STRESS)
GATED LEFT VENTRICULAR WALL MOTION STUDY
LEFT VENTRICULAR EJECTION FRACTION
TECHNIQUE: Standard myocardial SPECT imaging was performed after resting
intravenous injection of 10 mCi Qc-IIm sestamibi. Subsequently,
intravenous infusion of Lexiscan was performed under the supervision
of the Cardiology staff. At peak effect of the drug, 30 mCi Qc-IIm
sestamibi was injected intravenously and standard myocardial SPECT
imaging was performed. Quantitative gated imaging was also performed
to evaluate left ventricular wall motion, and estimate left
ventricular ejection fraction.

[Series 1: rest · 8.28mm/px · 6 of 64 frames shown]
[frame 6/64]
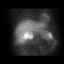
[frame 16/64]
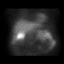
[frame 27/64]
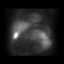
[frame 38/64]
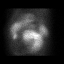
[frame 48/64]
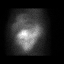
[frame 59/64]
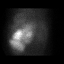

[Series 2: stress gated · 8.28mm/px · 6 of 64 frames shown]
[frame 6/64]
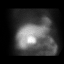
[frame 16/64]
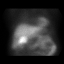
[frame 27/64]
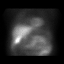
[frame 38/64]
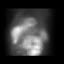
[frame 48/64]
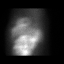
[frame 59/64]
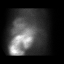

[12 of 12 positions shown; findings below may reference images not displayed]

FINDINGS: Baseline ECG shows sinus rhythm at 63 beats per min. Lexiscan bolus
was given in standard fashion. Heart rate increased from 61 beats
per min up to 78 beats per min, and blood pressure increased from
116/79 up to 118/75. No chest pain was reported. There were no
diagnostic ST segment abnormalities, and no arrhythmias were noted.

Analysis of the overall perfusion data shows anterior chest wall
soft tissue attenuation as well as diaphragmatic attenuation.

Tomographic views were obtained using the short axis, vertical long
axis, and horizontal long axis planes. There is a small, mild
intensity mid to basal inferolateral defect that is predominantly
fixed with a partial degree of reversibility in the basal zone. This
likely represents variable soft tissue attenuation, cannot
completely exclude a mild degree of ischemia.

Gated imaging reveals an EDV of 142, ESV of 60, LVEF of 58%, and no
focal wall motion abnormalities.
IMPRESSION: Overall low risk Lexiscan Cardiolite. There were no diagnostic ST
segment abnormalities or arrhythmias. Perfusion imaging is
suggestive of variable soft tissue attenuation, particularly in the
diaphragmatic distribution. Cannot completely exclude a very small
region of ischemia in the mid to basal inferolateral wall. LVEF is
normal at 58% with normal volumes and wall motion.

## 2014-10-27 ENCOUNTER — Other Ambulatory Visit: Payer: Self-pay | Admitting: *Deleted

## 2014-10-27 ENCOUNTER — Other Ambulatory Visit: Payer: Self-pay | Admitting: Cardiology

## 2014-10-27 MED ORDER — AMLODIPINE BESYLATE 5 MG PO TABS: 5.0000 mg | ORAL_TABLET | Freq: Every day | ORAL | Status: DC

## 2014-10-27 NOTE — Telephone Encounter (Signed)
Amlodipine  refilled today.

## 2014-12-11 ENCOUNTER — Encounter: Payer: Self-pay | Admitting: Cardiology

## 2014-12-11 ENCOUNTER — Ambulatory Visit (INDEPENDENT_AMBULATORY_CARE_PROVIDER_SITE_OTHER): Payer: Medicare Other | Admitting: Cardiology

## 2014-12-11 VITALS — BP 127/82 | HR 54 | Ht 71.0 in | Wt 292.0 lb

## 2014-12-11 DIAGNOSIS — R002 Palpitations: Secondary | ICD-10-CM | POA: Diagnosis not present

## 2014-12-11 DIAGNOSIS — G4733 Obstructive sleep apnea (adult) (pediatric): Secondary | ICD-10-CM | POA: Diagnosis not present

## 2014-12-11 DIAGNOSIS — I10 Essential (primary) hypertension: Secondary | ICD-10-CM

## 2014-12-11 DIAGNOSIS — R0789 Other chest pain: Secondary | ICD-10-CM

## 2014-12-11 NOTE — Patient Instructions (Signed)
   Referral to Pulmonology - Dr. Juanetta Gosling Continue all current medications. Your physician wants you to follow up in: 6 months.  You will receive a reminder letter in the mail one-two months in advance.  If you don't receive a letter, please call our office to schedule the follow up appointment

## 2014-12-11 NOTE — Progress Notes (Signed)
Patient ID: David Crosby, male   DOB: 1965-07-26, 49 y.o.   MRN: [id number]     Clinical Summary David Crosby is a 49 y.o.male seen today for follow up of the following medical problems.   1. Palpitations  - per notes, previously noted PVCs, PACs, and brief run of atach by monitoring. Also noted 3 second pause in early 4AM hour.  - only mild occasional palpitations since last visit, overall tolerable.    2. Chest pain  - 08/2012 DSE negative.  -  MPI 07/2013 low risk, no specific ischemia. Likely subdiaphragmatic attenuation.  - denies any recent chest pain  3. HTN  - compliant with meds  - does not check regluarly - reports some occasional orthostatic symptoms with standing, dizziness  4. OSA screen - referred last visit however he has not set up appointment with sleep medicine yet.   5. Hyperlipidemia - compliant with statin.    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  Past Medical History  Diagnosis Date  . Gout   . Cardiomegaly     Seen on chest x-ray  . Hypothyroidism   . Hyperlipidemia   . Nephritis   . Hypertension   . Renal insufficiency   . Asthma      No Known Allergies   Current Outpatient Prescriptions  Medication Sig Dispense Refill  . acebutolol (SECTRAL) 200 MG capsule TAKE TWO CAPSULES BY MOUTH ONCE DAILY IN THE MORNING, AND THEN ONE IN THE EVENING 60 capsule 0  . albuterol (PROVENTIL HFA;VENTOLIN HFA) 108 (90 BASE) MCG/ACT inhaler Inhale 2 puffs into the lungs every 6 (six) hours as needed for wheezing or shortness of breath. 1 Inhaler 2  . amLODipine (NORVASC) 5 MG tablet Take 1 tablet (5 mg total) by mouth daily. 90 tablet 3  . aspirin 81 MG tablet Take 81 mg by mouth daily.    Marland Kitchen azithromycin (ZITHROMAX) 500 MG tablet Take 500 mg 1st day and 250 mg daily for next 4 days 5 tablet 0  . escitalopram (LEXAPRO) 10 MG tablet Take 1 tablet by mouth daily.    . febuxostat (ULORIC) 40 MG tablet Take 40 mg by mouth 2 (two) times  daily.     Marland Kitchen HYDROcodone-acetaminophen (NORCO) 10-325 MG per tablet Take 1 tablet by mouth every 6 (six) hours as needed.    . indomethacin (INDOCIN) 50 MG capsule Take 50 mg by mouth as needed.    Marland Kitchen levothyroxine (SYNTHROID, LEVOTHROID) 100 MCG tablet Take 100 mcg by mouth daily before breakfast.    . lisinopril (PRINIVIL,ZESTRIL) 20 MG tablet Take 1 tablet by mouth 2 (two) times daily.    Marland Kitchen lovastatin (MEVACOR) 20 MG tablet Take 40 mg by mouth at bedtime.    . predniSONE (DELTASONE) 20 MG tablet Take 1 tablet (20 mg total) by mouth daily with breakfast. 10 tablet 0   No current facility-administered medications for this visit.     Past Surgical History  Procedure Laterality Date  . Left foot surgery      Multiple     No Known Allergies    Family History  Problem Relation Age of Onset  . Heart attack      Paternal and Maternal grandmother  . Hypertension      Several family member     Social History [name] reports that he has never smoked. He has never used smokeless tobacco. [name] reports that he does not drink alcohol.   Review of Systems CONSTITUTIONAL:  No weight loss, fever, chills, weakness or fatigue.  HEENT: Eyes: No visual loss, blurred vision, double vision or yellow sclerae.No hearing loss, sneezing, congestion, runny nose or sore throat.  SKIN: No rash or itching.  CARDIOVASCULAR: per HPI RESPIRATORY: No shortness of breath, cough or sputum.  GASTROINTESTINAL: No anorexia, nausea, vomiting or diarrhea. No abdominal pain or blood.  GENITOURINARY: No burning on urination, no polyuria NEUROLOGICAL: intermittent right hand numbness MUSCULOSKELETAL: No muscle, back pain, joint pain or stiffness.  LYMPHATICS: No enlarged nodes. No history of splenectomy.  PSYCHIATRIC: No history of depression or anxiety.  ENDOCRINOLOGIC: No reports of sweating, cold or heat intolerance. No polyuria or polydipsia.  Marland Kitchen   Physical Examination Filed Vitals:   12/11/14  1054  BP: 127/82  Pulse: 54   Filed Vitals:   12/11/14 1054  Height:  (1.803 m)  Weight: 292 lb (132.45 kg)    Gen: resting comfortably, no acute distress HEENT: no scleral icterus, pupils equal round and reactive, no palptable cervical adenopathy,  CV: RRR, no m/r/g, no jvd Resp: Clear to auscultation bilaterally GI: abdomen is soft, non-tender, non-distended, normal bowel sounds, no hepatosplenomegaly MSK: extremities are warm, no edema.  Skin: warm, no rash Neuro:  no focal deficits Psych: appropriate affect   Diagnostic Studies 08/2012 DSE  Baseline:  - LV, RV, and aortic size were normal. LA size was upper normal. AV and MV were normal. - LV global systolic function was normal. The estimated LV ejection fraction was 60%. - Normal wall motion; no LV regional wall motion abnormalities. Low dose:  - LV size was normal and appropriately decreased from the prior stage. - LV global systolic function was hyperdynamic and appropriately augmented from the prior stage. The estimated LV ejection fraction was 70%. - Normal wall motion; no LV regional wall motion abnormalities. - No evidence for new LV regional wall motion abnormalities. Peak stress:  - LV size was normal, appropriately decreased from the prior stage, and appropriately decreased from baseline. - LV global systolic function was hyperdynamic, appropriately augmented from the prior stage, and appropriately augmented from baseline. The estimated LV ejection fraction was 75%. - There is no diffuse LV hypokinesis. - No evidence for new LV regional wall motion abnormalities.  ------------------------------------------------------------ Stress echo results: Left ventricular ejection fraction was normal at rest and with stress. There was no echocardiographic evidence for stress-induced ischemia.  07/2013 MPI Tomographic views were obtained using the short axis, vertical long  axis, and horizontal long  axis planes. There is a small, mild  intensity mid to basal inferolateral defect that is predominantly  fixed with a partial degree of reversibility in the basal zone. This  likely represents variable soft tissue attenuation, cannot  completely exclude a mild degree of ischemia.  Gated imaging reveals an EDV of 142, ESV of 60, LVEF of 58%, and no  focal wall motion abnormalities.  IMPRESSION:  Overall low risk Lexiscan Cardiolite. There were no diagnostic ST  segment abnormalities or arrhythmias. Perfusion imaging is  suggestive of variable soft tissue attenuation, particularly in the  diaphragmatic distribution. Cannot completely exclude a very small  region of ischemia in the mid to basal inferolateral wall. LVEF is  normal at 58% with normal volumes and wall motion.  05/2014 CXR IMPRESSION: Mild cardiomegaly. There is no pneumonia, CHF, nor other acute cardiopulmonary abnormality.     Assessment and Plan   1. Palpitations  - continue current meds, well controlled - awaiting sleep study, if potential OSA then  treatment likely would improve his symptoms  2. Chest pain  - negative stress test, symptoms resolved since last visit. No further cardiac workup indicated.   3. HTN  - at goal, continue current meds  4. OSA screen + snoring, BMI 41 (severe obesity), some daytime somnolence, HTN, and cardiac arrhythmia. All are associated with possible OSA  - awaiting sleep study, will refer again to sleep medicine.   6. Hyperlipidemia - continue current statin   F/u 6 months  Antoine Poche, M.D.

## 2015-01-12 ENCOUNTER — Other Ambulatory Visit: Payer: Self-pay | Admitting: Cardiology

## 2015-04-13 ENCOUNTER — Other Ambulatory Visit: Payer: Self-pay | Admitting: Orthopedic Surgery

## 2015-04-13 DIAGNOSIS — M25512 Pain in left shoulder: Secondary | ICD-10-CM

## 2015-04-13 DIAGNOSIS — T1590XA Foreign body on external eye, part unspecified, unspecified eye, initial encounter: Secondary | ICD-10-CM

## 2015-04-23 ENCOUNTER — Ambulatory Visit
Admission: RE | Admit: 2015-04-23 | Discharge: 2015-04-23 | Disposition: A | Discharge status: Home / Self Care | Payer: Medicare Other | Source: Ambulatory Visit | Attending: Orthopedic Surgery | Admitting: Orthopedic Surgery

## 2015-04-23 DIAGNOSIS — M25512 Pain in left shoulder: Secondary | ICD-10-CM

## 2015-04-23 DIAGNOSIS — T1590XA Foreign body on external eye, part unspecified, unspecified eye, initial encounter: Secondary | ICD-10-CM

## 2015-05-01 ENCOUNTER — Other Ambulatory Visit: Payer: Self-pay | Admitting: Urology

## 2015-05-03 ENCOUNTER — Encounter (HOSPITAL_COMMUNITY): Payer: Self-pay

## 2015-05-06 MED ORDER — CIPROFLOXACIN HCL 500 MG PO TABS
500.0000 mg | ORAL_TABLET | ORAL | Status: AC
  Administered 2015-05-07: 500 mg via ORAL
  Filled 2015-05-06: qty 1

## 2015-05-06 NOTE — H&P (Signed)
Reason For Visit David Crosby presented to [facility] [place] over the weekend with left-sided renal colic and nausea/vomiting. He states a CT scan was performed noting a left ureteral calculus. He was given oxycodone and tamsulosin. No definitive intervention was performed. States prior history of passing one calculus in the emergency department prior to being evaluated by the EDP. He has never had any type of GU procedure. A review of the Epic system indicates a nonobstructing left renal stone in 2014 via CT scan.     He continues with left-sided renal colic radiating down into the groin with bothersome lower urinary tract symptoms. He has not seen any additional gross hematuria and remains afebrile. He does endorse night sweats and malaise. He is taking tamsulosin and managing his pain moderately with Percocet. He presents nauseated and in mild distress.      Past medical history is significant for gout, thyroid disease, asthma, and palpitations/arrhythmias. His last evaluation by cardiology indicated possibility of obstructive sleep apnea and a referral to the sleep study center as that physician thought treatment of obstructive sleep apnea with help improve a lot of his cardiac symptoms. It was also noted he was stable on his current medical therapies. He takes an aspirin but is not on any antiarrhythmic medications.   Past Medical History Problems  1. History of arthritis (Z87.39) 2. History of cardiac arrhythmia (Z86.79) 3. History of cardiac murmur (Z86.79) 4. History of esophageal reflux (Z87.19) 5. History of gout (Z87.39) 6. History of hypercholesterolemia (Z86.39) 7. History of hypertension (Z86.79) 8. History of hyperthyroidism (Z86.39)  Surgical History Problems  1. History of Ankle Surgery 2. History of Foot Surgery  Current Meds 1. Acebutolol HCl - 200 MG Oral Capsule;  Therapy: (Recorded:21Feb2017) to Recorded 2. AmLODIPine  Besylate 5 MG Oral Tablet;  Therapy: (Recorded:21Feb2017) to Recorded 3. Aspirin 81 MG TABS;  Therapy: (Recorded:21Feb2017) to Recorded 4. Breo Ellipta 200-25 MCG/INH Inhalation Aerosol Powder Breath Activated;  Therapy: (Recorded:21Feb2017) to Recorded 5. Hydrocodone-Acetaminophen 5-325 MG Oral Tablet;  Therapy: (Recorded:21Feb2017) to Recorded 6. Levothyroxine Sodium 100 MCG Oral Tablet;  Therapy: (Recorded:21Feb2017) to Recorded 7. Lisinopril 20 MG Oral Tablet;  Therapy: (Recorded:21Feb2017) to Recorded 8. Lovastatin 20 MG Oral Tablet;  Therapy: (Recorded:21Feb2017) to Recorded 9. Percocet TABS;  Therapy: (Recorded:21Feb2017) to Recorded 10. Uloric 80 MG Oral Tablet;   Therapy: (Recorded:21Feb2017) to Recorded 11. Ventolin HFA 108 (90 Base) MCG/ACT Inhalation Aerosol Solution;   Therapy: (Recorded:21Feb2017) to Recorded 12. Yosprala 81-40 MG Oral Tablet Delayed Release;   Therapy: (Recorded:21Feb2017) to Recorded  Allergies Medication  1. No Known Drug Allergies  Family History Problems  1. Family history of kidney stones (Z84.1) : Father  Social History Problems    Denied: History of Alcohol use   Caffeine use (F15.90)   2 a day   Disabled   Married   Never smoker  Review of Systems Genitourinary, constitutional, skin, eye, otolaryngeal, hematologic/lymphatic, cardiovascular, pulmonary, endocrine, musculoskeletal, gastrointestinal, neurological and psychiatric system(s) were reviewed and pertinent findings if present are noted and are otherwise negative.  Genitourinary: urinary frequency, dysuria, nocturia and hematuria.  Gastrointestinal: nausea, vomiting, flank pain and abdominal pain, but no diarrhea and no constipation.  Constitutional: night sweats and feeling poorly (malaise), but no fever.  Integumentary: skin rash/lesion.  Respiratory: shortness of breath and cough.  Musculoskeletal: back pain.    Vitals Vital Signs   Height: 5 ft 10 in Weight:  296 lb  BMI Calculated: 42.47 BSA Calculated: 2.47   Blood Pressure: 159 / 102 Temperature: 98 F Heart Rate: 79  Physical Exam Constitutional: Well nourished and well developed . No acute distress.  ENT:. The ears and nose are normal in appearance.  Neck: The appearance of the neck is normal and no neck mass is present.  Pulmonary: No respiratory distress and normal respiratory rhythm and effort.  Cardiovascular: Heart rate and rhythm are normal . No peripheral edema.  Abdomen: The abdomen is soft and nontender. No masses are palpated. No CVA tenderness. No hernias are palpable. No hepatosplenomegaly noted.  Genitourinary: Examination of the penis demonstrates no discharge, no masses, no lesions and a normal meatus. The scrotum is without lesions. The right epididymis is palpably normal and non-tender. The left epididymis is palpably normal and non-tender. The right testis is non-tender and without masses. The left testis is non-tender and without masses.  Lymphatics: The femoral and inguinal nodes are not enlarged or tender.  Skin: Normal skin turgor, no visible rash and no visible skin lesions.  Neuro/Psych:. Mood and affect are appropriate.    Results/Data Urine  COLOR AMBER  APPEARANCE CLEAR  SPECIFIC GRAVITY 1.020  pH 7.5  GLUCOSE NEGATIVE  BILIRUBIN NEGATIVE  KETONE NEGATIVE  BLOOD 2+  PROTEIN 2+  NITRITE NEGATIVE  LEUKOCYTE ESTERASE NEGATIVE  SQUAMOUS EPITHELIAL/HPF NONE SEEN HPF WBC NONE SEEN WBC/HPF RBC 3-10 RBC/HPF BACTERIA NONE SEEN HPF CRYSTALS NONE SEEN HPF CASTS NONE SEEN LPF Yeast NONE SEEN HPF  Old records or history reviewed: Dr Ben Herrick reviewed CT images and the patient's HPI.  The following images/tracing/specimen were independently visualized:  CT scan indicates a left proximal ureteral stone with hydroureter above the calculus. It measures approximately between 6-1/2 and 7-1/2 mm. Hounsfield units at the core R as high as 01-1199 but around the edges of  the decreased significantly.   ** RADIOLOGY REPORT BY [facility] **   CLINICAL DATA: Microhematuria and left lower quadrant abdominal pain for the last 4 days. History of renal calculi.  EXAM: CT ABDOMEN AND PELVIS WITHOUT CONTRAST  TECHNIQUE: Multidetector CT imaging of the abdomen and pelvis was performed following the standard protocol without IV contrast.  COMPARISON: 12/20/2012.  FINDINGS: Lower chest: The lung bases are grossly clear. Dependent atelectasis is noted. The heart is not imaged.  Hepatobiliary: No focal hepatic lesions or intrahepatic biliary dilatation. Mild focal fatty sparing noted around the gallbladder fossa. Mild diffuse fatty infiltration of the liver. The gallbladder is contracted. No common bile duct dilatation.  Pancreas: No mass, inflammation or ductal dilatation.  Spleen: Normal size. No focal lesions.  Adrenals/Urinary Tract: The adrenal glands are normal.  The right kidney is normal. No right renal or ureteral calculi.  The left kidney demonstrates moderate hydronephrosis. There is a 7.5 x 6.5 mm upper left ureteral calculus just below the UPJ. No distal ureteral calculus or bladder calculus. The prostate gland and seminal vesicles are unremarkable.  Stomach/Bowel: The stomach, duodenum, small bowel and colon are grossly normal without oral contrast. No inflammatory changes, mass lesions or obstructive findings. The terminal ileum is normal. The appendix is normal.  Vascular/Lymphatic: No mesenteric or retroperitoneal mass or adenopathy. Small scattered lymph nodes are noted. The aorta is normal in caliber. Minimal scattered atherosclerotic calcifications.  Other: No pelvic mass or adenopathy. No free pelvic fluid collections. No inguinal mass or adenopathy. No inguinal hernia.  Musculoskeletal: No significant bony findings. Bilateral pars defects are noted at L5.  IMPRESSION: 1. 7.5 x 6.5 mm left upper ureteral    calculus just below the UPJ. Moderate grade obstructive findings. 2. Lower pole left renal calculus. 3. No other significant abdominal/pelvic findings.   Electronically Signed  By: Rudie Meyer M.D.  On: 05/01/2015 16:55   Assessment  Renal colic secondary to a 7.5 x 6.5 mm left upper ureteral calculus just below the UPJ.  Plan   He felt significantly better after her promethazine and ketorolac injections. I spoke with the on-call urologist regarding his situation and we also reviewed his CT scan together. His recommendation was to first proceed with lithotripsy that the patient was agreeable to this plan. I discussed the risks and benefits of the procedure along with the possibility or need of a repeat lithotripsy/ureteroscopy should we not be able to clear his stone effectively after the initial treatment. All questions answered. I'll prescribe an additional analgesia in the form of oxycodone, refill tamsulosin, and provided prescription for promethazine. He understands to withhold NSAID and blood thinners prior to the procedure per instructions which is typically around 48 hours. Hydration and voiding strategies also discussed. We also went over several return to clinic or emergency department follow-up scenarios for worsening symptoms. Again understanding was expressed. He will follow-up with me postprocedure with a KUB and further assessment.

## 2015-05-06 NOTE — Discharge Instructions (Signed)
Lithotripsy, Care After °Refer to this sheet in the next few weeks. These instructions provide you with information on caring for yourself after your procedure. Your health care provider may also give you more specific instructions. Your treatment has been planned according to current medical practices, but problems sometimes occur. Call your health care provider if you have any problems or questions after your procedure. °WHAT TO EXPECT AFTER THE PROCEDURE  °· Your urine may have a red tinge for a few days after treatment. Blood loss is usually minimal. °· You may have soreness in the back or flank area. This usually goes away after a few days. The procedure can cause blotches or bruises on the back where the pressure wave enters the skin. These marks usually cause only minimal discomfort and should disappear in a short time. °· Stone fragments should begin to pass within 24 hours of treatment. However, a delayed passage is not unusual. °· You may have pain, discomfort, and feel sick to your stomach (nauseated) when the crushed fragments of stone are passed down the tube from the kidney to the bladder. Stone fragments can pass soon after the procedure and may last for up to 4-8 weeks. °· A small number of patients may have severe pain when stone fragments are not able to pass, which leads to an obstruction. °· If your stone is greater than 1 inch (2.5 cm) in diameter or if you have multiple stones that have a combined diameter greater than 1 inch (2.5 cm), you may require more than one treatment. °· If you had a stent placed prior to your procedure, you may experience some discomfort, especially during urination. You may experience the pain or discomfort in your flank or back, or you may experience a sharp pain or discomfort at the base of your penis or in your lower abdomen. The discomfort usually lasts only a few minutes after urinating. °HOME CARE INSTRUCTIONS  °· Rest at home until you feel your energy  improving. °· Only take over-the-counter or prescription medicines for pain, discomfort, or fever as directed by your health care provider. Depending on the type of lithotripsy, you may need to take antibiotics and anti-inflammatory medicines for a few days. °· Drink enough water and fluids to keep your urine clear or pale yellow. This helps "flush" your kidneys. It helps pass any remaining pieces of stone and prevents stones from coming back. °· Most people can resume daily activities within 1-2 days after standard lithotripsy. It can take longer to recover from laser and percutaneous lithotripsy. °· Strain all urine through the provided strainer. Keep all particulate matter and stones for your health care provider to see. The stone may be as small as a grain of salt. It is very important to use the strainer each and every time you pass your urine. Any stones that are found can be sent to a medical lab for examination. °· Visit your health care provider for a follow-up appointment in a few weeks. Your doctor may remove your stent if you have one. Your health care provider will also check to see whether stone particles still remain. °SEEK MEDICAL CARE IF:  °· Your pain is not relieved by medicine. °· You have a lasting nauseous feeling. °· You feel there is too much blood in the urine. °· You develop persistent problems with frequent or painful urination that does not at least partially improve after 2 days following the procedure. °· You have a congested cough. °· You feel   lightheaded. °· You develop a rash or any other signs that might suggest an allergic problem. °· You develop any reaction or side effects to your medicine(s). °SEEK IMMEDIATE MEDICAL CARE IF:  °· You experience severe back or flank pain or both. °· You see nothing but blood when you urinate. °· You cannot pass any urine at all. °· You have a fever or shaking chills. °· You develop shortness of breath, difficulty breathing, or chest pain. °· You  develop vomiting that will not stop after 6-8 hours. °· You have a fainting episode. °  °This information is not intended to replace advice given to you by your health care provider. Make sure you discuss any questions you have with your health care provider. °  °Document Released: 03/16/2007 Document Revised: 11/15/2014 Document Reviewed: 09/09/2012 °Elsevier Interactive Patient Education ©2016 [employer] ° °

## 2015-05-07 ENCOUNTER — Encounter (HOSPITAL_COMMUNITY)
Admission: RE | Disposition: A | Discharge status: Home / Self Care | Payer: Self-pay | Source: Ambulatory Visit | Attending: Urology

## 2015-05-07 ENCOUNTER — Ambulatory Visit (HOSPITAL_COMMUNITY): Payer: Medicare Other

## 2015-05-07 ENCOUNTER — Ambulatory Visit (HOSPITAL_COMMUNITY)
Admission: RE | Admit: 2015-05-07 | Discharge: 2015-05-07 | Disposition: A | Discharge status: Home / Self Care | Payer: Medicare Other | Source: Ambulatory Visit | Attending: Urology | Admitting: Urology

## 2015-05-07 ENCOUNTER — Encounter (HOSPITAL_COMMUNITY): Payer: Self-pay | Admitting: *Deleted

## 2015-05-07 DIAGNOSIS — M1990 Unspecified osteoarthritis, unspecified site: Secondary | ICD-10-CM | POA: Insufficient documentation

## 2015-05-07 DIAGNOSIS — I10 Essential (primary) hypertension: Secondary | ICD-10-CM | POA: Diagnosis not present

## 2015-05-07 DIAGNOSIS — Z79891 Long term (current) use of opiate analgesic: Secondary | ICD-10-CM | POA: Insufficient documentation

## 2015-05-07 DIAGNOSIS — Z87442 Personal history of urinary calculi: Secondary | ICD-10-CM | POA: Diagnosis not present

## 2015-05-07 DIAGNOSIS — J45909 Unspecified asthma, uncomplicated: Secondary | ICD-10-CM | POA: Diagnosis not present

## 2015-05-07 DIAGNOSIS — E0590 Thyrotoxicosis, unspecified without thyrotoxic crisis or storm: Secondary | ICD-10-CM | POA: Insufficient documentation

## 2015-05-07 DIAGNOSIS — Z79899 Other long term (current) drug therapy: Secondary | ICD-10-CM | POA: Insufficient documentation

## 2015-05-07 DIAGNOSIS — N201 Calculus of ureter: Secondary | ICD-10-CM | POA: Diagnosis present

## 2015-05-07 DIAGNOSIS — Z7982 Long term (current) use of aspirin: Secondary | ICD-10-CM | POA: Diagnosis not present

## 2015-05-07 DIAGNOSIS — E7800 Pure hypercholesterolemia, unspecified: Secondary | ICD-10-CM | POA: Diagnosis not present

## 2015-05-07 DIAGNOSIS — Z841 Family history of disorders of kidney and ureter: Secondary | ICD-10-CM | POA: Diagnosis not present

## 2015-05-07 DIAGNOSIS — N132 Hydronephrosis with renal and ureteral calculous obstruction: Secondary | ICD-10-CM | POA: Insufficient documentation

## 2015-05-07 DIAGNOSIS — M109 Gout, unspecified: Secondary | ICD-10-CM | POA: Insufficient documentation

## 2015-05-07 HISTORY — PX: LITHOTRIPSY: SUR834

## 2015-05-07 SURGERY — LITHOTRIPSY, ESWL
Anesthesia: LOCAL | Laterality: Left

## 2015-05-07 MED ORDER — TAMSULOSIN HCL 0.4 MG PO CAPS: 0.4000 mg | ORAL_CAPSULE | ORAL | Status: DC

## 2015-05-07 MED ORDER — DIPHENHYDRAMINE HCL 25 MG PO CAPS
25.0000 mg | ORAL_CAPSULE | ORAL | Status: AC
  Administered 2015-05-07: 25 mg via ORAL
  Filled 2015-05-07: qty 1

## 2015-05-07 MED ORDER — DIAZEPAM 5 MG PO TABS
10.0000 mg | ORAL_TABLET | ORAL | Status: AC
  Administered 2015-05-07: 10 mg via ORAL
  Filled 2015-05-07: qty 2

## 2015-05-07 MED ORDER — SODIUM CHLORIDE 0.9 % IV SOLN
INTRAVENOUS | Status: DC
  Administered 2015-05-07: 11:00:00 via INTRAVENOUS

## 2015-05-07 MED ORDER — OXYCODONE HCL 10 MG PO TABS: 10.0000 mg | ORAL_TABLET | ORAL | Status: DC | PRN

## 2015-05-07 NOTE — Op Note (Signed)
See [facility] OP note scanned into chart. Also because of the size, density, location and other factors that cannot be anticipated I feel this will likely be a staged procedure. This fact supersedes any indication in the scanned [facility] operative note to the contrary.  

## 2015-05-07 NOTE — Interval H&P Note (Signed)
History and Physical Interval Note:  05/07/2015 4:13 AM  David Crosby  has presented today for surgery, with the diagnosis of left ureteral stone  The various methods of treatment have been discussed with the patient and family. After consideration of risks, benefits and other options for treatment, the patient has consented to  Procedure(s): LEFT EXTRACORPOREAL SHOCK WAVE LITHOTRIPSY (ESWL) (Left) as a surgical intervention .  The patient's history has been reviewed, patient examined, no change in status, stable for surgery.  I have reviewed the patient's chart and labs.  Questions were answered to the patient's satisfaction.     [place]

## 2015-05-17 ENCOUNTER — Other Ambulatory Visit: Payer: Self-pay | Admitting: Urology

## 2015-05-18 NOTE — Patient Instructions (Addendum)
David SnipeWilliam P Crosby  05/18/2015   Your procedure is scheduled on: 05/23/2015    [facility]  [facility]  [facility]vators to 3rd floor to  Short Stay Center at     1230pm  Call this number if you have problems the morning of surgery [phone]   Remember: ONLY 1 PERSON MAY GO WITH YOU TO SHORT STAY TO GET  READY MORNING OF YOUR SURGERY.  Do not eat food after midnite.  May have clear liquids from 12 midnite until 0800am then nothing by mouth.      Take these medicines the morning of surgery with A SIP OF WATER: Albuterol Inhaler if needed and bring, Amlodipine ( NOrvasc), BreoEllipta and bring, Synthroid, Oxycodone if needed, Sectral                                 You may not have any metal on your body including hair pins and              piercings  Do not wear jewelry, , lotions, powders or perfumes, deodorant                      Men may shave face and neck.   Do not bring valuables to the hospital. [facility] IS NOT             RESPONSIBLE   FOR VALUABLES.  Contacts, dentures or bridgework may not be worn into surgery.      Patients discharged the day of surgery will not be allowed to drive home.  Name and phone number of your driver:  Special Instructions: coughing and deep breathing exercises, leg exercises    CLEAR LIQUID DIET   Foods Allowed                                                                     Foods Excluded  Coffee and tea, regular and decaf                             liquids that you cannot  Plain Jell-O in any flavor                                             see through such as: Fruit ices (not with fruit pulp)                                     milk, soups, orange juice  Iced Popsicles                                    All solid food Carbonated beverages, regular and diet  Cranberry, grape and apple juices Sports drinks like Gatorade Lightly seasoned clear broth or  consume(fat free) Sugar, honey syrup  Sample Menu Breakfast                                Lunch                                     Supper Cranberry juice                    Beef broth                            Chicken broth Jell-O                                     Grape juice                           Apple juice Coffee or tea                        Jell-O                                      Popsicle                                                Coffee or tea                        Coffee or tea  _____________________________________________________________________                Please read over the following fact sheets you were given: _____________________________________________________________________             [facility] - Preparing for Surgery Before surgery, you can play an important role.  Because skin is not sterile, your skin needs to be as free of germs as possible.  You can reduce the number of germs on your skin by washing with CHG (chlorahexidine gluconate) soap before surgery.  CHG is an antiseptic cleaner which kills germs and bonds with the skin to continue killing germs even after washing. Please DO NOT use if you have an allergy to CHG or antibacterial soaps.  If your skin becomes reddened/irritated stop using the CHG and inform your nurse when you arrive at Short Stay. Do not shave (including legs and underarms) for at least 48 hours prior to the first CHG shower.  You may shave your face/neck. Please follow these instructions carefully:  1.  Shower with CHG Soap the night before surgery and the  morning of Surgery.  2.  If you choose to wash your hair, wash your hair first as usual with your  normal  shampoo.  3.  After you shampoo, rinse your hair and body thoroughly to remove the  shampoo.  4.  Use CHG as you would any other liquid soap.  You can apply chg directly  to the skin and wash                       Gently with a scrungie  or clean washcloth.  5.  Apply the CHG Soap to your body ONLY FROM THE NECK DOWN.   Do not use on face/ open                           Wound or open sores. Avoid contact with eyes, ears mouth and genitals (private parts).                       Wash face,  Genitals (private parts) with your normal soap.             6.  Wash thoroughly, paying special attention to the area where your surgery  will be performed.  7.  Thoroughly rinse your body with warm water from the neck down.  8.  DO NOT shower/wash with your normal soap after using and rinsing off  the CHG Soap.                9.  Pat yourself dry with a clean towel.            10.  Wear clean pajamas.            11.  Place clean sheets on your bed the night of your first shower and do not  sleep with pets. Day of Surgery : Do not apply any lotions/deodorants the morning of surgery.  Please wear clean clothes to the hospital/surgery center.  FAILURE TO FOLLOW THESE INSTRUCTIONS MAY RESULT IN THE CANCELLATION OF YOUR SURGERY PATIENT SIGNATURE_________________________________  NURSE SIGNATURE__________________________________  ________________________________________________________________________

## 2015-05-21 ENCOUNTER — Encounter (HOSPITAL_COMMUNITY): Payer: Self-pay

## 2015-05-21 ENCOUNTER — Encounter (INDEPENDENT_AMBULATORY_CARE_PROVIDER_SITE_OTHER): Payer: Self-pay

## 2015-05-21 ENCOUNTER — Encounter (HOSPITAL_COMMUNITY)
Admission: RE | Admit: 2015-05-21 | Discharge: 2015-05-21 | Disposition: A | Discharge status: Home / Self Care | Payer: Medicare Other | Source: Ambulatory Visit | Attending: Urology | Admitting: Urology

## 2015-05-21 DIAGNOSIS — I10 Essential (primary) hypertension: Secondary | ICD-10-CM | POA: Diagnosis not present

## 2015-05-21 DIAGNOSIS — Z87442 Personal history of urinary calculi: Secondary | ICD-10-CM | POA: Diagnosis not present

## 2015-05-21 DIAGNOSIS — E039 Hypothyroidism, unspecified: Secondary | ICD-10-CM | POA: Diagnosis not present

## 2015-05-21 DIAGNOSIS — E7800 Pure hypercholesterolemia, unspecified: Secondary | ICD-10-CM | POA: Diagnosis not present

## 2015-05-21 DIAGNOSIS — Z7982 Long term (current) use of aspirin: Secondary | ICD-10-CM | POA: Diagnosis not present

## 2015-05-21 DIAGNOSIS — J45909 Unspecified asthma, uncomplicated: Secondary | ICD-10-CM | POA: Diagnosis not present

## 2015-05-21 DIAGNOSIS — N201 Calculus of ureter: Secondary | ICD-10-CM | POA: Diagnosis present

## 2015-05-21 DIAGNOSIS — M109 Gout, unspecified: Secondary | ICD-10-CM | POA: Diagnosis not present

## 2015-05-21 DIAGNOSIS — Z79899 Other long term (current) drug therapy: Secondary | ICD-10-CM | POA: Diagnosis not present

## 2015-05-21 HISTORY — DX: Reserved for inherently not codable concepts without codable children: IMO0001

## 2015-05-21 HISTORY — DX: Nausea with vomiting, unspecified: Z98.890

## 2015-05-21 HISTORY — DX: Pneumonia, unspecified organism: J18.9

## 2015-05-21 HISTORY — DX: Gastro-esophageal reflux disease without esophagitis: K21.9

## 2015-05-21 HISTORY — DX: Nausea with vomiting, unspecified: R11.2

## 2015-05-21 HISTORY — DX: Cardiac murmur, unspecified: R01.1

## 2015-05-21 HISTORY — DX: Palpitations: R00.2

## 2015-05-21 LAB — CBC
HEMATOCRIT: 46.8 % (ref 39.0–52.0)
Hemoglobin: 15.8 g/dL (ref 13.0–17.0)
MCH: 30.4 pg (ref 26.0–34.0)
MCHC: 33.8 g/dL (ref 30.0–36.0)
MCV: 90.2 fL (ref 78.0–100.0)
PLATELETS: 279 10*3/uL (ref 150–400)
RBC: 5.19 MIL/uL (ref 4.22–5.81)
RDW: 12.4 % (ref 11.5–15.5)
WBC: 7.4 10*3/uL (ref 4.0–10.5)

## 2015-05-21 LAB — BASIC METABOLIC PANEL
ANION GAP: 9 (ref 5–15)
BUN: 21 mg/dL — ABNORMAL HIGH (ref 6–20)
CO2: 26 mmol/L (ref 22–32)
Calcium: 9.1 mg/dL (ref 8.9–10.3)
Chloride: 100 mmol/L — ABNORMAL LOW (ref 101–111)
Creatinine, Ser: 1.63 mg/dL — ABNORMAL HIGH (ref 0.61–1.24)
GFR, EST AFRICAN AMERICAN: 56 mL/min — AB (ref >60)
GFR, EST NON AFRICAN AMERICAN: 48 mL/min — AB (ref >60)
GLUCOSE: 107 mg/dL — AB (ref 65–99)
POTASSIUM: 4.5 mmol/L (ref 3.5–5.1)
Sodium: 135 mmol/L (ref 135–145)

## 2015-05-21 NOTE — Progress Notes (Signed)
Requested LOV note from [facility] done 04/02/2015.

## 2015-05-21 NOTE — Progress Notes (Signed)
BMP done 05/21/2015 faxed via EPIC to Dr Marlou PorchHerrick.

## 2015-05-21 NOTE — Progress Notes (Signed)
Patient has scored a 5 on the STOP BANG Assessment Tool for Obstructive Sleep Apnea during a preop appointment.  This patient is considered at risk for Obstructive Sleep Apnea using this tool

## 2015-05-21 NOTE — Progress Notes (Signed)
EKG-12/11/14-EPIC  Stress-2015-EPIC  [facility]

## 2015-05-22 ENCOUNTER — Encounter (HOSPITAL_COMMUNITY): Payer: Self-pay | Admitting: *Deleted

## 2015-05-22 MED ORDER — DEXTROSE 5 % IV SOLN
3.0000 g | INTRAVENOUS | Status: AC
  Administered 2015-05-23: 3 g via INTRAVENOUS
  Filled 2015-05-22: qty 3000

## 2015-05-22 NOTE — Progress Notes (Signed)
Received and placed on chart LOV note from Dr Fayrene FearingJames Deterding on 04/02/2015.

## 2015-05-23 ENCOUNTER — Encounter (HOSPITAL_COMMUNITY): Payer: Self-pay

## 2015-05-23 ENCOUNTER — Ambulatory Visit (HOSPITAL_COMMUNITY): Payer: Medicare Other | Admitting: Certified Registered Nurse Anesthetist

## 2015-05-23 ENCOUNTER — Ambulatory Visit (HOSPITAL_COMMUNITY)
Admission: RE | Admit: 2015-05-23 | Discharge: 2015-05-23 | Disposition: A | Discharge status: Home / Self Care | Payer: Medicare Other | Source: Ambulatory Visit | Attending: Urology | Admitting: Urology

## 2015-05-23 ENCOUNTER — Encounter (HOSPITAL_COMMUNITY)
Admission: RE | Disposition: A | Discharge status: Home / Self Care | Payer: Self-pay | Source: Ambulatory Visit | Attending: Urology

## 2015-05-23 DIAGNOSIS — Z79899 Other long term (current) drug therapy: Secondary | ICD-10-CM | POA: Insufficient documentation

## 2015-05-23 DIAGNOSIS — E039 Hypothyroidism, unspecified: Secondary | ICD-10-CM | POA: Insufficient documentation

## 2015-05-23 DIAGNOSIS — E7800 Pure hypercholesterolemia, unspecified: Secondary | ICD-10-CM | POA: Insufficient documentation

## 2015-05-23 DIAGNOSIS — J45909 Unspecified asthma, uncomplicated: Secondary | ICD-10-CM | POA: Diagnosis not present

## 2015-05-23 DIAGNOSIS — M109 Gout, unspecified: Secondary | ICD-10-CM | POA: Insufficient documentation

## 2015-05-23 DIAGNOSIS — N200 Calculus of kidney: Secondary | ICD-10-CM

## 2015-05-23 DIAGNOSIS — N201 Calculus of ureter: Secondary | ICD-10-CM | POA: Diagnosis not present

## 2015-05-23 DIAGNOSIS — Z87442 Personal history of urinary calculi: Secondary | ICD-10-CM | POA: Insufficient documentation

## 2015-05-23 DIAGNOSIS — Z7982 Long term (current) use of aspirin: Secondary | ICD-10-CM | POA: Insufficient documentation

## 2015-05-23 DIAGNOSIS — I10 Essential (primary) hypertension: Secondary | ICD-10-CM | POA: Insufficient documentation

## 2015-05-23 HISTORY — PX: HOLMIUM LASER APPLICATION: SHX5852

## 2015-05-23 HISTORY — PX: CYSTOSCOPY WITH RETROGRADE PYELOGRAM, URETEROSCOPY AND STENT PLACEMENT: SHX5789

## 2015-05-23 HISTORY — DX: Motion sickness, initial encounter: T75.3XXA

## 2015-05-23 SURGERY — CYSTOURETEROSCOPY, WITH RETROGRADE PYELOGRAM AND STENT INSERTION
Anesthesia: General | Site: Ureter | Laterality: Left

## 2015-05-23 MED ORDER — ACETAMINOPHEN 10 MG/ML IV SOLN
INTRAVENOUS | Status: DC | PRN
  Administered 2015-05-23: 1000 mg via INTRAVENOUS

## 2015-05-23 MED ORDER — PHENYLEPHRINE HCL 10 MG/ML IJ SOLN
10.0000 mg | INTRAVENOUS | Status: DC | PRN
  Administered 2015-05-23: 30 ug/min via INTRAVENOUS

## 2015-05-23 MED ORDER — LACTATED RINGERS IV SOLN
INTRAVENOUS | Status: DC
  Administered 2015-05-23: 16:00:00 via INTRAVENOUS
  Administered 2015-05-23: 1000 mL via INTRAVENOUS

## 2015-05-23 MED ORDER — PROPOFOL 10 MG/ML IV BOLUS
INTRAVENOUS | Status: AC
  Filled 2015-05-23: qty 20

## 2015-05-23 MED ORDER — HYDROMORPHONE HCL 1 MG/ML IJ SOLN: 0.2500 mg | INTRAMUSCULAR | Status: DC | PRN

## 2015-05-23 MED ORDER — LIDOCAINE HCL 2 % EX GEL
CUTANEOUS | Status: DC | PRN
  Administered 2015-05-23: 1

## 2015-05-23 MED ORDER — PHENYLEPHRINE HCL 10 MG/ML IJ SOLN
INTRAMUSCULAR | Status: DC | PRN
  Administered 2015-05-23 (×3): 160 ug via INTRAVENOUS

## 2015-05-23 MED ORDER — DEXAMETHASONE SODIUM PHOSPHATE 10 MG/ML IJ SOLN
INTRAMUSCULAR | Status: AC
  Filled 2015-05-23: qty 1

## 2015-05-23 MED ORDER — PHENYLEPHRINE 40 MCG/ML (10ML) SYRINGE FOR IV PUSH (FOR BLOOD PRESSURE SUPPORT)
PREFILLED_SYRINGE | INTRAVENOUS | Status: AC
  Filled 2015-05-23: qty 20

## 2015-05-23 MED ORDER — SCOPOLAMINE 1 MG/3DAYS TD PT72
MEDICATED_PATCH | TRANSDERMAL | Status: AC
  Filled 2015-05-23: qty 1

## 2015-05-23 MED ORDER — SODIUM CHLORIDE 0.9 % IR SOLN
Status: DC | PRN
  Administered 2015-05-23: 3000 mL via INTRAVESICAL
  Administered 2015-05-23: 1000 mL via INTRAVESICAL

## 2015-05-23 MED ORDER — SCOPOLAMINE 1 MG/3DAYS TD PT72
1.0000 | MEDICATED_PATCH | Freq: Once | TRANSDERMAL | Status: AC
  Administered 2015-05-23: 1 via TRANSDERMAL
  Administered 2015-05-23: 1.5 mg via TRANSDERMAL

## 2015-05-23 MED ORDER — LABETALOL HCL 5 MG/ML IV SOLN
INTRAVENOUS | Status: DC | PRN
  Administered 2015-05-23: 5 mg via INTRAVENOUS

## 2015-05-23 MED ORDER — LIDOCAINE HCL (CARDIAC) 20 MG/ML IV SOLN
INTRAVENOUS | Status: DC | PRN
  Administered 2015-05-23: 75 mg via INTRAVENOUS
  Administered 2015-05-23: 25 mg via INTRATRACHEAL

## 2015-05-23 MED ORDER — FENTANYL CITRATE (PF) 100 MCG/2ML IJ SOLN
INTRAMUSCULAR | Status: DC | PRN
  Administered 2015-05-23 (×3): 50 ug via INTRAVENOUS
  Administered 2015-05-23: 100 ug via INTRAVENOUS

## 2015-05-23 MED ORDER — MIDAZOLAM HCL 5 MG/5ML IJ SOLN
INTRAMUSCULAR | Status: DC | PRN
  Administered 2015-05-23: 2 mg via INTRAVENOUS

## 2015-05-23 MED ORDER — PROMETHAZINE HCL 25 MG/ML IJ SOLN: 6.2500 mg | INTRAMUSCULAR | Status: DC | PRN

## 2015-05-23 MED ORDER — ACETAMINOPHEN 10 MG/ML IV SOLN
INTRAVENOUS | Status: AC
  Filled 2015-05-23: qty 100

## 2015-05-23 MED ORDER — IOHEXOL 300 MG/ML  SOLN
INTRAMUSCULAR | Status: DC | PRN
  Administered 2015-05-23: 50 mL via URETHRAL

## 2015-05-23 MED ORDER — SODIUM CHLORIDE 0.9 % IV SOLN
INTRAVENOUS | Status: DC | PRN
  Administered 2015-05-23: 18:00:00 via INTRAVENOUS

## 2015-05-23 MED ORDER — LIDOCAINE HCL 2 % EX GEL
CUTANEOUS | Status: AC
  Filled 2015-05-23: qty 5

## 2015-05-23 MED ORDER — FENTANYL CITRATE (PF) 250 MCG/5ML IJ SOLN
INTRAMUSCULAR | Status: AC
  Filled 2015-05-23: qty 5

## 2015-05-23 MED ORDER — BELLADONNA ALKALOIDS-OPIUM 16.2-60 MG RE SUPP
RECTAL | Status: AC
  Filled 2015-05-23: qty 1

## 2015-05-23 MED ORDER — BELLADONNA ALKALOIDS-OPIUM 16.2-60 MG RE SUPP
RECTAL | Status: DC | PRN
  Administered 2015-05-23 (×2): 1 via RECTAL

## 2015-05-23 MED ORDER — TROSPIUM CHLORIDE ER 60 MG PO CP24: 60.0000 mg | ORAL_CAPSULE | Freq: Every day | ORAL | Status: DC

## 2015-05-23 MED ORDER — PROPOFOL 10 MG/ML IV BOLUS
INTRAVENOUS | Status: DC | PRN
  Administered 2015-05-23: 200 mg via INTRAVENOUS

## 2015-05-23 MED ORDER — TRAMADOL HCL 50 MG PO TABS: 50.0000 mg | ORAL_TABLET | Freq: Four times a day (QID) | ORAL | Status: DC | PRN

## 2015-05-23 MED ORDER — SUCCINYLCHOLINE CHLORIDE 20 MG/ML IJ SOLN
INTRAMUSCULAR | Status: DC | PRN
  Administered 2015-05-23: 80 mg via INTRAVENOUS
  Administered 2015-05-23: 100 mg via INTRAVENOUS
  Administered 2015-05-23: 50 mg via INTRAVENOUS

## 2015-05-23 MED ORDER — ONDANSETRON HCL 4 MG/2ML IJ SOLN
INTRAMUSCULAR | Status: DC | PRN
  Administered 2015-05-23: 4 mg via INTRAVENOUS

## 2015-05-23 MED ORDER — PROPOFOL 500 MG/50ML IV EMUL
INTRAVENOUS | Status: DC | PRN
  Administered 2015-05-23: 100 ug/kg/min via INTRAVENOUS

## 2015-05-23 MED ORDER — PHENYLEPHRINE HCL 10 MG/ML IJ SOLN
INTRAMUSCULAR | Status: AC
  Filled 2015-05-23: qty 1

## 2015-05-23 MED ORDER — ONDANSETRON HCL 4 MG/2ML IJ SOLN
INTRAMUSCULAR | Status: AC
  Filled 2015-05-23: qty 4

## 2015-05-23 MED ORDER — MIDAZOLAM HCL 2 MG/2ML IJ SOLN
INTRAMUSCULAR | Status: AC
  Filled 2015-05-23: qty 2

## 2015-05-23 MED ORDER — PHENAZOPYRIDINE HCL 200 MG PO TABS: 200.0000 mg | ORAL_TABLET | Freq: Three times a day (TID) | ORAL | Status: DC | PRN

## 2015-05-23 MED ORDER — DEXAMETHASONE SODIUM PHOSPHATE 10 MG/ML IJ SOLN
INTRAMUSCULAR | Status: DC | PRN
  Administered 2015-05-23: 10 mg via INTRAVENOUS

## 2015-05-23 MED ORDER — LIDOCAINE HCL (CARDIAC) 20 MG/ML IV SOLN
INTRAVENOUS | Status: AC
  Filled 2015-05-23: qty 5

## 2015-05-23 MED ORDER — MEPERIDINE HCL 50 MG/ML IJ SOLN: 6.2500 mg | INTRAMUSCULAR | Status: DC | PRN

## 2015-05-23 SURGICAL SUPPLY — 25 items
BAG URO CATCHER STRL LF (MISCELLANEOUS) ×3 IMPLANT
BASKET DAKOTA 1.9FR 11X120 (BASKET) ×3 IMPLANT
BASKET ZERO TIP NITINOL 2.4FR (BASKET) ×3 IMPLANT
CATH URET 5FR 28IN OPEN ENDED (CATHETERS) ×3 IMPLANT
CLOTH BEACON ORANGE TIMEOUT ST (SAFETY) ×3 IMPLANT
FIBER LASER FLEXIVA 1000 (UROLOGICAL SUPPLIES) IMPLANT
FIBER LASER FLEXIVA 200 (UROLOGICAL SUPPLIES) ×3 IMPLANT
FIBER LASER FLEXIVA 365 (UROLOGICAL SUPPLIES) IMPLANT
FIBER LASER FLEXIVA 550 (UROLOGICAL SUPPLIES) IMPLANT
FIBER LASER TRAC TIP (UROLOGICAL SUPPLIES) IMPLANT
GLOVE BIOGEL M STRL SZ7.5 (GLOVE) ×3 IMPLANT
GOWN STRL REUS W/TWL XL LVL3 (GOWN DISPOSABLE) ×3 IMPLANT
GUIDEWIRE ANG ZIPWIRE 038X150 (WIRE) IMPLANT
GUIDEWIRE STR DUAL SENSOR (WIRE) ×6 IMPLANT
KIT BALLIN UROMAX 15FX10 (LABEL) ×1 IMPLANT
MANIFOLD NEPTUNE II (INSTRUMENTS) ×3 IMPLANT
PACK CYSTO (CUSTOM PROCEDURE TRAY) ×3 IMPLANT
SET HIGH PRES BAL DIL (LABEL) ×2
SHEATH ACCESS URETERAL 24CM (SHEATH) IMPLANT
SHEATH ACCESS URETERAL 38CM (SHEATH) ×3 IMPLANT
SHEATH ACCESS URETERAL 54CM (SHEATH) IMPLANT
STENT URET 6FRX26 CONTOUR (STENTS) ×3 IMPLANT
SYRINGE IRR TOOMEY STRL 70CC (SYRINGE) ×3 IMPLANT
TUBING CONNECTING 10 (TUBING) ×2 IMPLANT
TUBING CONNECTING 10' (TUBING) ×1

## 2015-05-23 NOTE — Anesthesia Procedure Notes (Signed)
Procedure Name: Intubation Date/Time: 05/23/2015 4:00 PM Performed by: Illene SilverEVANS, Yamira Papa E Pre-anesthesia Checklist: Patient identified, Emergency Drugs available, Suction available and Patient being monitored Patient Re-evaluated:Patient Re-evaluated prior to inductionOxygen Delivery Method: Circle System Utilized Preoxygenation: Pre-oxygenation with 100% oxygen Intubation Type: IV induction Ventilation: Mask ventilation without difficulty Laryngoscope Size: Mac, Glidescope and 4 Grade View: Grade III Tube type: Oral Tube size: 7.5 mm Number of attempts: 2 (one by CRNA  unable to angle anterior larynx , Dr. Kizzie IdeGermeroff placed with  incresed   turning and angle  anteriorly) Airway Equipment and Method: Stylet,  Oral airway and Video-laryngoscopy Placement Confirmation: ETT inserted through vocal cords under direct vision,  positive ETCO2 and breath sounds checked- equal and bilateral Tube secured with: Tape Dental Injury: Teeth and Oropharynx as per pre-operative assessment  Difficulty Due To: Difficulty was anticipated, Difficult Airway- due to anterior larynx, Difficult Airway- due to limited oral opening and Difficult Airway- due to reduced neck mobility Future Recommendations: Recommend- induction with short-acting agent, and alternative techniques readily available Comments: Larynx very anterior  And   ETT needed  Increased hockey stick  Angle.  See note

## 2015-05-23 NOTE — Interval H&P Note (Signed)
History and Physical Interval Note: Patient presents today for follow-up ureteroscopy after failed shockwave lithotripsy. There are no changes to the patient's history and physical. We discussed the nature of surgery as well as the risks and benefits in quite some detail. The patient is eager to proceed.  05/23/2015 3:38 PM  Arna SnipeWilliam P Frees  has presented today for surgery, with the diagnosis of LEFT URETERAL CALCULUS LEFT RENAL CALCULUS   The various methods of treatment have been discussed with the patient and family. After consideration of risks, benefits and other options for treatment, the patient has consented to  Procedure(s): CYSTOSCOPY WITH LEFT RETROGRADE PYELOGRAM, LEFT URETEROSCOPY  BASKET EXTRACTION AND DOUBLE J STENT PLACEMENT (Left) HOLMIUM LASER LITHOTRIPSY  (Left) as a surgical intervention .  The patient's history has been reviewed, patient examined, no change in status, stable for surgery.  I have reviewed the patient's chart and labs.  Questions were answered to the patient's satisfaction.     Berniece SalinesHERRICK, Lauralie Blacksher W

## 2015-05-23 NOTE — Op Note (Signed)
Preoperative diagnosis:  1. Left mid-ureteral stone   Postoperative diagnosis:  1. same   Procedure: 1. Cystoscopy, left retrograde pyelogram with interpretation. 2. Left distal ureteral balloon dilation 3. Left ureteroscopy, laser lithotripsy 4. Left ureteral stent placement  Surgeon: Crist FatBenjamin W. Herrick, MD  Anesthesia: General  Complications: None  Intraoperative findings: #1. Left retrograde pyelogram and indicated a narrowed ureter with a small stricture at the level of the pelvic inlet, there was a small filling defect in the mid ureter, there was mild to moderate hydronephrosis proximal to this.  #2. Narrowed left distal ureter requiring balloon dilation to navigate the ureter.  #3. The ureteral stone was very dense and difficult to fragment. #4. The lower pole nonobstructing stone was not encountered during the procedure. Despite my searching of the lower pole, was unable to identify the stone. I suspect that it is still within the parenchyma.  EBL: Minimal  Specimens: Left ureteral stone was taken to align it urology for further stone analysis.  Indication: David Crosby is a 50 y.o. patient with left mid ureteral stone. The patient has already undergone shockwave lithotripsy and had no success of passing a stone..  After reviewing the management options for treatment, he elected to proceed with the above surgical procedure(s). We have discussed the potential benefits and risks of the procedure, side effects of the proposed treatment, the likelihood of the patient achieving the goals of the procedure, and any potential problems that might occur during the procedure or recuperation. Informed consent has been obtained.  Description of procedure:  The patient was taken to the operating room and general anesthesia was induced.  The patient was placed in the dorsal lithotomy position, prepped and draped in the usual sterile fashion, and preoperative antibiotics were  administered. A preoperative time-out was performed.   A 21 French 30 cystoscope was then gently passed to the patient's urethra and into the bladder. Cystoscopy was performed and no unusual findings were noted. The mucosa was normal. Without tumors. The ureteral orifices were in orthotopic position. The left ureteral orifice was then cannulated with a 5 [place] open-ended ureteral catheter and a retrograde pyelogram was performed using 10 mL Omnipaque contrast with the above findings. I then advanced a 0.38 sensor wire into the left ureter through the open-ended catheter and into the left renal pelvis. The catheter was then removed over the wire. I then advanced a 6 French semirigid ureteroscope into the patient's urethra and into the left ureter under visual guidance. I needed a second wire to "railroad track" through into the ureter. With gentle pressure I was able to advance through the distal ureter. I then encountered the stone in the mid ureter and using the 200  fiber and attempted to fragment the stone, but the stone was retropulsed into the kidney. At this point, I advanced a second wire through the ureteroscope backed the ureteroscope out gently. I then attempted to pass a 12/14 [place] ureteral access sheath through the ureter, but was unsuccessful and needed to dilate the ureter with a 15 French 10 cm balloon dilator. Once this was performed I was then more easily able to pass the 12/14 [place] ureteral access sheath into the mid ureter. Upon entering the ureter there was a significant amount of bleeding going on in the renal pelvis. There were lots of clots within the ureter and the pelvis. Visualization was poor in the beginning of the case. Once the bleeding settled down and I was able to evacuate the  clots with a basket I was able to easily navigate the collecting system. Using fluoroscopic guidance I was able to systematically inspect the calyces. Ultimately, I did encounter the stone over to  [place] to the kidney in the mid ureter and was able to pull this back in the ureter with a basket and while the stone was in the basket fragmented again into pieces that were small enough to be removed through the sheath. I then went back and reinspected all the calyces and I did not encounter any additional stones. I was able to see the stone under fluoroscopy, but was unable to get there with the scope. This was in the lower pole and presumably within the parenchyma still. At this point, I backed out the ureteroscope and the sheath simultaneously under visual guidance. The ureter appeared to be intact without significant trauma. I then backloaded the safety wire through the cystoscope and using the Seldinger technique advanced a 28 cm times 6 French double-J ureteral stent over the wire under fluoroscopic guidance and into the left kidney. Once in the renal pelvis I pulled the wire back and advanced the stent further so that the distal end curled nicely in the bladder once the wire completely removed. The bladder was then emptied.  I then placed a B and O suppository into the patient's rectum and lidocaine jelly into the patient's urethra. The patient was subsequently extubated and returned to the PACU in stable condition.  Crist Fat, M.D.

## 2015-05-23 NOTE — Transfer of Care (Signed)
Immediate Anesthesia Transfer of Care Note  Patient: Arna SnipeWilliam P Sohail  Procedure(s) Performed: Procedure(s): CYSTOSCOPY, LEFT RETROGRADE PYELOGRAM, LEFT URETEROSCOPY,  BASKET EXTRACTION AND DOUBLE J STENT PLACEMENT LEFT URETERAL DILATION (Left) HOLMIUM LASER LITHOTRIPSY  (Left)  Patient Location: PACU  Anesthesia Type:General  Level of Consciousness: awake, alert , oriented and patient cooperative  Airway & Oxygen Therapy: Patient Spontanous Breathing and Patient connected to face mask oxygen  Post-op Assessment: Report given to RN, Post -op Vital signs reviewed and stable and Patient moving all extremities X 4  Post vital signs: stable  Last Vitals:  Filed Vitals:   05/23/15 1251 05/23/15 1819  BP: 159/89 173/89  Pulse:  69  Temp:  36.7 C  Resp:  20    Complications: No apparent anesthesia complications

## 2015-05-23 NOTE — Anesthesia Postprocedure Evaluation (Signed)
Anesthesia Post Note  Patient: Arna SnipeWilliam P Whatley  Procedure(s) Performed: Procedure(s) (LRB): CYSTOSCOPY, LEFT RETROGRADE PYELOGRAM, LEFT URETEROSCOPY,  BASKET EXTRACTION AND DOUBLE J STENT PLACEMENT LEFT URETERAL DILATION (Left) HOLMIUM LASER LITHOTRIPSY  (Left)  Patient location during evaluation: PACU Anesthesia Type: General Level of consciousness: sedated and patient cooperative Pain management: pain level controlled Vital Signs Assessment: post-procedure vital signs reviewed and stable Respiratory status: spontaneous breathing Cardiovascular status: stable Anesthetic complications: no    Last Vitals:  Filed Vitals:   05/23/15 1845 05/23/15 1925  BP: 144/75 146/89  Pulse: 65 63  Temp:  36.8 C  Resp: 18 18    Last Pain:  Filed Vitals:   05/23/15 1929  PainSc: 0-No pain                 Lewie LoronJohn Roger Kettles

## 2015-05-23 NOTE — Anesthesia Preprocedure Evaluation (Addendum)
Anesthesia Evaluation  Patient identified by MRN, date of birth, ID band Patient awake    Reviewed: Allergy & Precautions, NPO status , Patient's Chart, lab work & pertinent test results  History of Anesthesia Complications (+) PONV and history of anesthetic complications  Airway Mallampati: II  TM Distance: >3 FB Neck ROM: Full    Dental no notable dental hx.    Pulmonary shortness of breath, asthma , pneumonia,    Pulmonary exam normal breath sounds clear to auscultation       Cardiovascular hypertension, Pt. on medications Normal cardiovascular exam+ Valvular Problems/Murmurs  Rhythm:Regular Rate:Normal     Neuro/Psych negative neurological ROS  negative psych ROS   GI/Hepatic Neg liver ROS, GERD  ,  Endo/Other  Hypothyroidism Morbid obesity  Renal/GU Renal diseasenegative Renal ROS     Musculoskeletal negative musculoskeletal ROS (+)   Abdominal (+) + obese,   Peds  Hematology negative hematology ROS (+)   Anesthesia Other Findings   Reproductive/Obstetrics negative OB ROS                            Anesthesia Physical Anesthesia Plan  ASA: III  Anesthesia Plan: General   Post-op Pain Management:    Induction: Intravenous  Airway Management Planned: Oral ETT  Additional Equipment:   Intra-op Plan:   Post-operative Plan: Extubation in OR  Informed Consent: I have reviewed the patients History and Physical, chart, labs and discussed the procedure including the risks, benefits and alternatives for the proposed anesthesia with the patient or authorized representative who has indicated his/her understanding and acceptance.   Dental advisory given  Plan Discussed with: CRNA  Anesthesia Plan Comments:        Anesthesia Quick Evaluation

## 2015-05-23 NOTE — H&P (View-Only) (Signed)
Reason For Visit [name] presented to [facility] over the weekend with left-sided renal colic and nausea/vomiting. He states a CT scan was performed noting a left ureteral calculus. He was given oxycodone and tamsulosin. No definitive intervention was performed. States prior history of passing one calculus in the emergency department prior to being evaluated by the EDP. He has never had any type of GU procedure. A review of the Epic system indicates a nonobstructing left renal stone in 2014 via CT scan.     He continues with left-sided renal colic radiating down into the groin with bothersome lower urinary tract symptoms. He has not seen any additional gross hematuria and remains afebrile. He does endorse night sweats and malaise. He is taking tamsulosin and managing his pain moderately with Percocet. He presents nauseated and in mild distress.      Past medical history is significant for gout, thyroid disease, asthma, and palpitations/arrhythmias. His last evaluation by cardiology indicated possibility of obstructive sleep apnea and a referral to the sleep study center as that physician thought treatment of obstructive sleep apnea with help improve a lot of his cardiac symptoms. It was also noted he was stable on his current medical therapies. He takes an aspirin but is not on any antiarrhythmic medications.   Past Medical History Problems  1. History of arthritis (Z87.39) 2. History of cardiac arrhythmia (Z86.79) 3. History of cardiac murmur (Z86.79) 4. History of esophageal reflux (Z87.19) 5. History of gout (Z87.39) 6. History of hypercholesterolemia (Z86.39) 7. History of hypertension (Z86.79) 8. History of hyperthyroidism (Z86.39)  Surgical History Problems  1. History of Ankle Surgery 2. History of Foot Surgery  Current Meds 1. Acebutolol HCl - 200 MG Oral Capsule;  Therapy: (Recorded:21Feb2017) to Recorded 2. AmLODIPine  Besylate 5 MG Oral Tablet;  Therapy: (Recorded:21Feb2017) to Recorded 3. Aspirin 81 MG TABS;  Therapy: (Recorded:21Feb2017) to Recorded 4. Breo Ellipta 200-25 MCG/INH Inhalation Aerosol Powder Breath Activated;  Therapy: (Recorded:21Feb2017) to Recorded 5. Hydrocodone-Acetaminophen 5-325 MG Oral Tablet;  Therapy: (Recorded:21Feb2017) to Recorded 6. Levothyroxine Sodium 100 MCG Oral Tablet;  Therapy: (Recorded:21Feb2017) to Recorded 7. Lisinopril 20 MG Oral Tablet;  Therapy: (Recorded:21Feb2017) to Recorded 8. Lovastatin 20 MG Oral Tablet;  Therapy: (Recorded:21Feb2017) to Recorded 9. Percocet TABS;  Therapy: (Recorded:21Feb2017) to Recorded 10. Uloric 80 MG Oral Tablet;   Therapy: (Recorded:21Feb2017) to Recorded 11. Ventolin HFA 108 (90 Base) MCG/ACT Inhalation Aerosol Solution;   Therapy: (Recorded:21Feb2017) to Recorded 12. Yosprala 81-40 MG Oral Tablet Delayed Release;   Therapy: (Recorded:21Feb2017) to Recorded  Allergies Medication  1. No Known Drug Allergies  Family History Problems  1. Family history of kidney stones (Z84.1) : Father  Social History Problems    Denied: History of Alcohol use   Caffeine use (F15.90)   2 a day   Disabled   Married   Never smoker  Review of Systems Genitourinary, constitutional, skin, eye, otolaryngeal, hematologic/lymphatic, cardiovascular, pulmonary, endocrine, musculoskeletal, gastrointestinal, neurological and psychiatric system(s) were reviewed and pertinent findings if present are noted and are otherwise negative.  Genitourinary: urinary frequency, dysuria, nocturia and hematuria.  Gastrointestinal: nausea, vomiting, flank pain and abdominal pain, but no diarrhea and no constipation.  Constitutional: night sweats and feeling poorly (malaise), but no fever.  Integumentary: skin rash/lesion.  Respiratory: shortness of breath and cough.  Musculoskeletal: back pain.    Vitals Vital Signs   Height: 5 ft 10 in Weight:  296 lb  BMI Calculated: 42.47 BSA Calculated: 2.47  Blood Pressure: 159 / 102 Temperature: 98 F Heart Rate: 79  Physical Exam Constitutional: Well nourished and well developed . No acute distress.  ENT:. The ears and nose are normal in appearance.  Neck: The appearance of the neck is normal and no neck mass is present.  Pulmonary: No respiratory distress and normal respiratory rhythm and effort.  Cardiovascular: Heart rate and rhythm are normal . No peripheral edema.  Abdomen: The abdomen is soft and nontender. No masses are palpated. No CVA tenderness. No hernias are palpable. No hepatosplenomegaly noted.  Genitourinary: Examination of the penis demonstrates no discharge, no masses, no lesions and a normal meatus. The scrotum is without lesions. The right epididymis is palpably normal and non-tender. The left epididymis is palpably normal and non-tender. The right testis is non-tender and without masses. The left testis is non-tender and without masses.  Lymphatics: The femoral and inguinal nodes are not enlarged or tender.  Skin: Normal skin turgor, no visible rash and no visible skin lesions.  Neuro/Psych:. Mood and affect are appropriate.    Results/Data Urine  COLOR AMBER  APPEARANCE CLEAR  SPECIFIC GRAVITY 1.020  pH 7.5  GLUCOSE NEGATIVE  BILIRUBIN NEGATIVE  KETONE NEGATIVE  BLOOD 2+  PROTEIN 2+  NITRITE NEGATIVE  LEUKOCYTE ESTERASE NEGATIVE  SQUAMOUS EPITHELIAL/HPF NONE SEEN HPF WBC NONE SEEN WBC/HPF RBC 3-10 RBC/HPF BACTERIA NONE SEEN HPF CRYSTALS NONE SEEN HPF CASTS NONE SEEN LPF Yeast NONE SEEN HPF  Old records or history reviewed: Dr Cordella Register reviewed CT images and the patient's HPI.  The following images/tracing/specimen were independently visualized:  CT scan indicates a left proximal ureteral stone with hydroureter above the calculus. It measures approximately between 6-1/2 and 7-1/2 mm. Hounsfield units at the core R as high as 01-1199 but around the edges of  the decreased significantly.   ** RADIOLOGY REPORT BY [facility] **   CLINICAL DATA: Microhematuria and left lower quadrant abdominal pain for the last 4 days. History of renal calculi.  EXAM: CT ABDOMEN AND PELVIS WITHOUT CONTRAST  TECHNIQUE: Multidetector CT imaging of the abdomen and pelvis was performed following the standard protocol without IV contrast.  COMPARISON: 12/20/2012.  FINDINGS: Lower chest: The lung bases are grossly clear. Dependent atelectasis is noted. The heart is not imaged.  Hepatobiliary: No focal hepatic lesions or intrahepatic biliary dilatation. Mild focal fatty sparing noted around the gallbladder fossa. Mild diffuse fatty infiltration of the liver. The gallbladder is contracted. No common bile duct dilatation.  Pancreas: No mass, inflammation or ductal dilatation.  Spleen: Normal size. No focal lesions.  Adrenals/Urinary Tract: The adrenal glands are normal.  The right kidney is normal. No right renal or ureteral calculi.  The left kidney demonstrates moderate hydronephrosis. There is a 7.5 x 6.5 mm upper left ureteral calculus just below the UPJ. No distal ureteral calculus or bladder calculus. The prostate gland and seminal vesicles are unremarkable.  Stomach/Bowel: The stomach, duodenum, small bowel and colon are grossly normal without oral contrast. No inflammatory changes, mass lesions or obstructive findings. The terminal ileum is normal. The appendix is normal.  Vascular/Lymphatic: No mesenteric or retroperitoneal mass or adenopathy. Small scattered lymph nodes are noted. The aorta is normal in caliber. Minimal scattered atherosclerotic calcifications.  Other: No pelvic mass or adenopathy. No free pelvic fluid collections. No inguinal mass or adenopathy. No inguinal hernia.  Musculoskeletal: No significant bony findings. Bilateral pars defects are noted at L5.  IMPRESSION: 1. 7.5 x 6.5 mm left upper ureteral  calculus just below the UPJ. Moderate grade obstructive findings. 2. Lower pole left renal calculus. 3. No other significant abdominal/pelvic findings.   Electronically Signed  By: Rudie MeyerP. Gallerani M.D.  On: 05/01/2015 16:55   Assessment  Renal colic secondary to a 7.5 x 6.5 mm left upper ureteral calculus just below the UPJ.  Plan   He felt significantly better after her promethazine and ketorolac injections. I spoke with the on-call urologist regarding his situation and we also reviewed his CT scan together. His recommendation was to first proceed with lithotripsy that the patient was agreeable to this plan. I discussed the risks and benefits of the procedure along with the possibility or need of a repeat lithotripsy/ureteroscopy should we not be able to clear his stone effectively after the initial treatment. All questions answered. I'll prescribe an additional analgesia in the form of oxycodone, refill tamsulosin, and provided prescription for promethazine. He understands to withhold NSAID and blood thinners prior to the procedure per instructions which is typically around 48 hours. Hydration and voiding strategies also discussed. We also went over several return to clinic or emergency department follow-up scenarios for worsening symptoms. Again understanding was expressed. He will follow-up with me postprocedure with a KUB and further assessment.

## 2015-05-23 NOTE — Discharge Instructions (Signed)
DISCHARGE INSTRUCTIONS FOR KIDNEY STONE/URETERAL STENT   MEDICATIONS:  1.  Resume all your other meds from home - except do not take any extra narcotic pain meds that you may have at home.  2. Trospium is to prevent bladder spasms and help reduce urinary frequency. 3. Pyridium is to help with the burning/stinging when you urinate. 4. Tramadol is for moderate/severe pain, otherwise taking upto 1000mg  every 6 hours of plainTylenol will help treat your pain.     ACTIVITY:  1. No strenuous activity x 1week  2. No driving while on narcotic pain medications  3. Drink plenty of water  4. Continue to walk at home - you can still get blood clots when you are at home, so keep active, but don't over do it.  5. May return to work/school tomorrow or when you feel ready   BATHING:  1. You can shower and we recommend daily showers   SIGNS/SYMPTOMS TO CALL:  Please call us if you have a fever greater than 101.5, uncontrolled nausea/vomiting, uncontrolled pain, dizziness, unable to urinate, bloody urine, chest pain, shortness of breath, leg swelling, leg pain, redness around wound, drainage from wound, or any other concerns or questions.   You can reach us at [phone].   FOLLOW-UP:  You have an appointment for stent removal in 1 week.

## 2015-05-24 ENCOUNTER — Encounter (HOSPITAL_COMMUNITY): Payer: Self-pay | Admitting: Urology

## 2015-06-11 ENCOUNTER — Ambulatory Visit (INDEPENDENT_AMBULATORY_CARE_PROVIDER_SITE_OTHER): Payer: Medicare Other | Admitting: Cardiology

## 2015-06-11 ENCOUNTER — Encounter: Payer: Self-pay | Admitting: Cardiology

## 2015-06-11 VITALS — BP 167/96 | HR 62 | Ht 71.0 in | Wt 295.2 lb

## 2015-06-11 DIAGNOSIS — E785 Hyperlipidemia, unspecified: Secondary | ICD-10-CM

## 2015-06-11 DIAGNOSIS — R0789 Other chest pain: Secondary | ICD-10-CM

## 2015-06-11 DIAGNOSIS — G4730 Sleep apnea, unspecified: Secondary | ICD-10-CM

## 2015-06-11 DIAGNOSIS — R002 Palpitations: Secondary | ICD-10-CM

## 2015-06-11 MED ORDER — AMLODIPINE BESYLATE 10 MG PO TABS: 10.0000 mg | ORAL_TABLET | Freq: Every day | ORAL | Status: DC

## 2015-06-11 NOTE — Progress Notes (Signed)
Patient ID: David Crosby, male   DOB: 11/09/1965, 50 y.o.   MRN: [id number]     Clinical Summary David Crosby is a 51 y.o.male seen today for follow up of the following medical problems.    1. Palpitations  - per notes, previously noted PVCs, PACs, and brief run of atach by monitoring. Also noted 3 second pause in early 4AM hour.  - increased fluttering since last visit. Occurs few times a week, lasts about 1 minute. Can cause some SOB. Can occur at rest or with activity. Limited caffeine, no EtOH. Compliant with acebutolol   2. Chest pain  - 08/2012 DSE negative.  - MPI 07/2013 low risk, no specific ischemia. Likely subdiaphragmatic attenuation.  - can still have some occasional mild chest tightness at times, primarily occurs at rest.   3. HTN  - checks at home occasionally. Reports prior to kidneys stones 120/70s at home. Over the last few weeks elevated to 150s/90s.   4. OSA screen - referred last visit however he has not set up appointment with sleep medicine yet.   5. Hyperlipidemia - compliant with statin.   6. SOB - progressing over the last 3 months. DOE at 1/2 block. Carrying groceries in from house can cause symptoms - mild occasional chest tightness that can occur at rest. Can have some diaphoresis, some nausea - notes some LE edema. Can have some PND. Weight up 292 to 295.     Past Medical History  Diagnosis Date  . Gout   . Cardiomegaly     Seen on chest x-ray  . Hypothyroidism   . Hyperlipidemia   . Hypertension   . Asthma   . PONV (postoperative nausea and vomiting)   . Palpitation   . Heart murmur   . Shortness of breath dyspnea     with exertion   . Pneumonia     hx of pneumonia several times   . GERD (gastroesophageal reflux disease)   . Nephritis   . Renal insufficiency     Stage II kidney disease per [facility] note of 04/02/2015   . Nephritis   . Motion sickness      No Known Allergies   Current Outpatient Prescriptions    Medication Sig Dispense Refill  . acebutolol (SECTRAL) 200 MG capsule Take two capsules by mouth in the morning and take one capsule by mouth in the evening (Patient taking differently: Take 200 mg by mouth 2 (two) times daily. ) 90 capsule 6  . albuterol (PROVENTIL HFA;VENTOLIN HFA) 108 (90 Base) MCG/ACT inhaler Inhale 1 puff into the lungs every 6 (six) hours as needed for wheezing or shortness of breath.    Marland Kitchen amLODipine (NORVASC) 5 MG tablet Take 1 tablet (5 mg total) by mouth daily. 90 tablet 3  . aspirin 81 MG tablet Take 81 mg by mouth daily.    . febuxostat (ULORIC) 40 MG tablet Take 80 mg by mouth daily.     . fluticasone furoate-vilanterol (BREO ELLIPTA) 200-25 MCG/INH AEPB Inhale 1 puff into the lungs as needed.     Marland Kitchen levothyroxine (SYNTHROID, LEVOTHROID) 100 MCG tablet Take 100 mcg by mouth daily before breakfast.    . lisinopril (PRINIVIL,ZESTRIL) 20 MG tablet Take 1 tablet by mouth daily.     Marland Kitchen lovastatin (MEVACOR) 20 MG tablet Take 20 mg by mouth at bedtime.     . Oxycodone HCl 10 MG TABS Take 1 tablet (10 mg total) by mouth every 4 (four) hours as  needed. (Patient taking differently: Take 10 mg by mouth every 4 (four) hours as needed (for pain). ) 30 tablet 0  . phenazopyridine (PYRIDIUM) 200 MG tablet Take 1 tablet (200 mg total) by mouth 3 (three) times daily as needed for pain. 10 tablet 0  . tamsulosin (FLOMAX) 0.4 MG CAPS capsule Take 1 capsule (0.4 mg total) by mouth daily after supper. (Patient taking differently: Take 0.4 mg by mouth daily after supper. ) 30 capsule 0  . traMADol (ULTRAM) 50 MG tablet Take 1-2 tablets (50-100 mg total) by mouth every 6 (six) hours as needed for moderate pain. 30 tablet 0  . Trospium Chloride 60 MG CP24 Take 1 capsule (60 mg total) by mouth daily. 7 each 0   No current facility-administered medications for this visit.     Past Surgical History  Procedure Laterality Date  . Left foot surgery      Multiple  . Left forehead surgery        due to broken sinus cavity   . Lithotripsy Left 05/07/2015  . Cystoscopy with retrograde pyelogram, ureteroscopy and stent placement Left 05/23/2015    Procedure: CYSTOSCOPY, LEFT RETROGRADE PYELOGRAM, LEFT URETEROSCOPY,  BASKET EXTRACTION AND DOUBLE J STENT PLACEMENT LEFT URETERAL DILATION;  Surgeon: Crist FatBenjamin W Herrick, MD;  Location: [facility];  Service: Urology;  Laterality: Left;  . Holmium laser application Left 05/23/2015    Procedure: HOLMIUM LASER LITHOTRIPSY ;  Surgeon: Crist FatBenjamin W Herrick, MD;  Location: [facility];  Service: Urology;  Laterality: Left;     No Known Allergies    Family History  Problem Relation Age of Onset  . Heart attack      Paternal and Maternal grandmother  . Hypertension      Several family member     Social History David Crosby reports that he has never smoked. He has never used smokeless tobacco. David Crosby reports that he does not drink alcohol.   Review of Systems CONSTITUTIONAL: No weight loss, fever, chills, weakness or fatigue.  HEENT: Eyes: No visual loss, blurred vision, double vision or yellow sclerae.No hearing loss, sneezing, congestion, runny nose or sore throat.  SKIN: No rash or itching.  CARDIOVASCULAR: per hpi RESPIRATORY: No shortness of breath, cough or sputum.  GASTROINTESTINAL: No anorexia, nausea, vomiting or diarrhea. No abdominal pain or blood.  GENITOURINARY: No burning on urination, no polyuria NEUROLOGICAL: No headache, dizziness, syncope, paralysis, ataxia, numbness or tingling in the extremities. No change in bowel or bladder control.  MUSCULOSKELETAL: No muscle, back pain, joint pain or stiffness.  LYMPHATICS: No enlarged nodes. No history of splenectomy.  PSYCHIATRIC: No history of depression or anxiety.  ENDOCRINOLOGIC: No reports of sweating, cold or heat intolerance. No polyuria or polydipsia.  Marland Kitchen.   Physical Examination Filed Vitals:   06/11/15 0901  BP: 167/96  Pulse: 62   Filed Vitals:   06/11/15 0901  Height:  5\' 11"  (1.803 m)  Weight: 295 lb 3.2 oz (133.902 kg)   Gen: resting comfortably, no acute distress HEENT: no scleral icterus, pupils equal round and reactive, no palptable cervical adenopathy,  CV: RRR, no m/r/g, no jvd Resp: Clear to auscultation bilaterally GI: abdomen is soft, non-tender, non-distended, normal bowel sounds, no hepatosplenomegaly MSK: extremities are warm, no edema.  Skin: warm, no rash Neuro:  no focal deficits Psych: appropriate affect   Diagnostic Studies 08/2012 DSE  Baseline:  - LV, RV, and aortic size were normal. LA size was upper normal. AV and MV were normal. -  LV global systolic function was normal. The estimated LV ejection fraction was 60%. - Normal wall motion; no LV regional wall motion abnormalities. Low dose:  - LV size was normal and appropriately decreased from the prior stage. - LV global systolic function was hyperdynamic and appropriately augmented from the prior stage. The estimated LV ejection fraction was 70%. - Normal wall motion; no LV regional wall motion abnormalities. - No evidence for new LV regional wall motion abnormalities. Peak stress:  - LV size was normal, appropriately decreased from the prior stage, and appropriately decreased from baseline. - LV global systolic function was hyperdynamic, appropriately augmented from the prior stage, and appropriately augmented from baseline. The estimated LV ejection fraction was 75%. - There is no diffuse LV hypokinesis. - No evidence for new LV regional wall motion abnormalities.  ------------------------------------------------------------ Stress echo results: Left ventricular ejection fraction was normal at rest and with stress. There was no echocardiographic evidence for stress-induced ischemia.  07/2013 MPI Tomographic views were obtained using the short axis, vertical long  axis, and horizontal long axis planes. There is a small, mild  intensity mid to basal  inferolateral defect that is predominantly  fixed with a partial degree of reversibility in the basal zone. This  likely represents variable soft tissue attenuation, cannot  completely exclude a mild degree of ischemia.  Gated imaging reveals an EDV of 142, ESV of 60, LVEF of 58%, and no  focal wall motion abnormalities.  IMPRESSION:  Overall low risk Lexiscan Cardiolite. There were no diagnostic ST  segment abnormalities or arrhythmias. Perfusion imaging is  suggestive of variable soft tissue attenuation, particularly in the  diaphragmatic distribution. Cannot completely exclude a very small  region of ischemia in the mid to basal inferolateral wall. LVEF is  normal at 58% with normal volumes and wall motion.  05/2014 CXR IMPRESSION: Mild cardiomegaly. There is no pneumonia, CHF, nor other acute cardiopulmonary abnormality.     Assessment and Plan   1. Palpitations  - recent increase in symptoms. Beta blocker therapy limited due to bradycardia.  - with recent increase in paliptations and fatigue and prior history of both bradycardia and tachycardia will repeat event monitor  2. Chest pain  - long history of chest pain, previous negative stress tests. Now with mild nonexertional symptoms, continue to monitor  3. HTN  - elevated, will increase norvasc to  daily.   4. OSA screen + snoring, BMI 41 (severe obesity), some daytime somnolence, HTN, and cardiac arrhythmia. All are associated with possible OSA  - awaiting sleep study, will refer again to sleep medicine. Encouraged to make appointment.   6. Hyperlipidemia - continue current statin  7. Preoperative evaluation - patient scheduled for shoulder surgery. Given recent symptoms would recommend postoponing until further cardiac testing is complete   F/u 3-4 weeks    Antoine Poche, M.D.

## 2015-06-11 NOTE — Patient Instructions (Signed)
Your physician recommends that you schedule a follow-up appointment in: 3-4 WEEKS WITH DR. BRANCH  Your physician has recommended you make the following change in your medication:   INCREASE AMLODIPINE 10 MG DAILY  Your physician has recommended that you wear an event monitor 7 DAYS. Event monitors are medical devices that record the heart's electrical activity. Doctors most often us these monitors to diagnose arrhythmias. Arrhythmias are problems with the speed or rhythm of the heartbeat. The monitor is a small, portable device. You can wear one while you do your normal daily activities. This is usually used to diagnose what is causing palpitations/syncope (passing out).  Your physician recommends that you return for lab work IN 1 WEEK BMP  You have been referred to DR. HAWKINS  Your physician has requested that you regularly monitor and record your blood pressure readings at home AND BRING TO YOUR NEXT OFFICE VISIT. Please use the same machine at the same time of day to check your readings and record them to bring to your follow-up visit.  Thank you for choosing [facility]!!

## 2015-06-14 ENCOUNTER — Ambulatory Visit (INDEPENDENT_AMBULATORY_CARE_PROVIDER_SITE_OTHER): Payer: Medicare Other

## 2015-06-14 DIAGNOSIS — R002 Palpitations: Secondary | ICD-10-CM

## 2015-06-22 ENCOUNTER — Encounter: Payer: Self-pay | Admitting: *Deleted

## 2015-06-27 ENCOUNTER — Telehealth: Payer: Self-pay | Admitting: *Deleted

## 2015-06-27 NOTE — Telephone Encounter (Signed)
-----   Message from Antoine PocheJonathan F Branch, MD sent at 06/27/2015  9:55 AM EDT ----- Heart monitor overall looks good, no signifiacant abnormal rhythms. We will discuss further at our f/u  Dominga FerryJ Branch MD

## 2015-06-27 NOTE — Telephone Encounter (Signed)
Pt aware, routed to pcp 

## 2015-06-28 ENCOUNTER — Other Ambulatory Visit (HOSPITAL_COMMUNITY): Payer: Self-pay | Admitting: Respiratory Therapy

## 2015-06-28 DIAGNOSIS — G4730 Sleep apnea, unspecified: Secondary | ICD-10-CM

## 2015-06-28 DIAGNOSIS — R0602 Shortness of breath: Secondary | ICD-10-CM

## 2015-07-03 ENCOUNTER — Other Ambulatory Visit (HOSPITAL_COMMUNITY): Payer: Self-pay | Admitting: Respiratory Therapy

## 2015-07-04 ENCOUNTER — Ambulatory Visit (HOSPITAL_COMMUNITY): Admission: RE | Admit: 2015-07-04 | Payer: Medicare Other | Source: Ambulatory Visit

## 2015-07-13 ENCOUNTER — Ambulatory Visit: Payer: Medicare Other | Admitting: Cardiology

## 2015-07-13 NOTE — Progress Notes (Unsigned)
Patient ID: MAURILIO PURYEAR, male   DOB: June 20, 1965, 50 y.o.   MRN: [id number]     Clinical Summary Mr. Catena is a 50 y.o.male  1. Palpitations  - per notes, previously noted PVCs, PACs, and brief run of atach by monitoring. Also noted 3 second pause in early 4AM hour.  - increased fluttering since last visit. Occurs few times a week, lasts about 1 minute. Can cause some SOB. Can occur at rest or with activity. Limited caffeine, no EtOH. Compliant with acebutolol  - since last visit completed event monitor that showed no significant arrhythmias, no symptoms reported.   2. Chest pain  - 08/2012 DSE negative.  - MPI 07/2013 low risk, no specific ischemia. Likely subdiaphragmatic attenuation.  - can still have some occasional mild chest tightness at times, primarily occurs at rest.   3. HTN  - checks at home occasionally. Reports prior to kidneys stones 120/70s at home. Over the last few weeks elevated to 150s/90s.   4. OSA screen - referred last visit however he has not set up appointment with sleep medicine yet.   5. Hyperlipidemia - compliant with statin.   6. SOB - progressing over the last 3 months. DOE at 1/2 block. Carrying groceries in from house can cause symptoms - mild occasional chest tightness that can occur at rest. Can have some diaphoresis, some nausea - notes some LE edema. Can have some PND. Weight up 292 to 295.   - PFTs pending? Past Medical History  Diagnosis Date  . Gout   . Cardiomegaly     Seen on chest x-ray  . Hypothyroidism   . Hyperlipidemia   . Hypertension   . Asthma   . PONV (postoperative nausea and vomiting)   . Palpitation   . Heart murmur   . Shortness of breath dyspnea     with exertion   . Pneumonia     hx of pneumonia several times   . GERD (gastroesophageal reflux disease)   . Nephritis   . Renal insufficiency     Stage II kidney disease per [facility] OV note of 04/02/2015   . Nephritis   . Motion sickness       No Known Allergies   Current Outpatient Prescriptions  Medication Sig Dispense Refill  . acebutolol (SECTRAL) 200 MG capsule Take two capsules by mouth in the morning and take one capsule by mouth in the evening (Patient taking differently: Take 200 mg by mouth 2 (two) times daily. ) 90 capsule 6  . albuterol (PROVENTIL HFA;VENTOLIN HFA) 108 (90 Base) MCG/ACT inhaler Inhale 1 puff into the lungs every 6 (six) hours as needed for wheezing or shortness of breath.    Marland Kitchen amLODipine (NORVASC) 10 MG tablet Take 1 tablet (10 mg total) by mouth daily. 90 tablet 3  . aspirin 81 MG tablet Take 81 mg by mouth daily. Reported on 06/11/2015    . febuxostat (ULORIC) 40 MG tablet Take 80 mg by mouth daily.     . fluticasone furoate-vilanterol (BREO ELLIPTA) 200-25 MCG/INH AEPB Inhale 1 puff into the lungs as needed.     Marland Kitchen levothyroxine (SYNTHROID, LEVOTHROID) 100 MCG tablet Take 100 mcg by mouth daily before breakfast.    . lisinopril (PRINIVIL,ZESTRIL) 20 MG tablet Take 1 tablet by mouth daily.     Marland Kitchen lovastatin (MEVACOR) 20 MG tablet Take 20 mg by mouth at bedtime.     . phenazopyridine (PYRIDIUM) 200 MG tablet Take 1 tablet (200 mg total)  by mouth 3 (three) times daily as needed for pain. 10 tablet 0  . tamsulosin (FLOMAX) 0.4 MG CAPS capsule Take 1 capsule (0.4 mg total) by mouth daily after supper. (Patient taking differently: Take 0.4 mg by mouth daily after supper. ) 30 capsule 0  . traMADol (ULTRAM) 50 MG tablet Take 1-2 tablets (50-100 mg total) by mouth every 6 (six) hours as needed for moderate pain. 30 tablet 0  . Trospium Chloride 60 MG CP24 Take 1 capsule (60 mg total) by mouth daily. 7 each 0   No current facility-administered medications for this visit.     Past Surgical History  Procedure Laterality Date  . Left foot surgery      Multiple  . Left forehead surgery       due to broken sinus cavity   . Lithotripsy Left 05/07/2015  . Cystoscopy with retrograde pyelogram, ureteroscopy  and stent placement Left 05/23/2015    Procedure: CYSTOSCOPY, LEFT RETROGRADE PYELOGRAM, LEFT URETEROSCOPY,  BASKET EXTRACTION AND DOUBLE J STENT PLACEMENT LEFT URETERAL DILATION;  Surgeon: Crist FatBenjamin W Herrick, MD;  Location: [facility];  Service: Urology;  Laterality: Left;  . Holmium laser application Left 05/23/2015    Procedure: HOLMIUM LASER LITHOTRIPSY ;  Surgeon: Crist FatBenjamin W Herrick, MD;  Location: [facility];  Service: Urology;  Laterality: Left;     No Known Allergies    Family History  Problem Relation Age of Onset  . Heart attack      Paternal and Maternal grandmother  . Hypertension      Several family member     Social History Mr. [name] reports that he has never smoked. He has never used smokeless tobacco. Mr. [name] reports that he does not drink alcohol.   Review of Systems CONSTITUTIONAL: No weight loss, fever, chills, weakness or fatigue.  HEENT: Eyes: No visual loss, blurred vision, double vision or yellow sclerae.No hearing loss, sneezing, congestion, runny nose or sore throat.  SKIN: No rash or itching.  CARDIOVASCULAR:  RESPIRATORY: No shortness of breath, cough or sputum.  GASTROINTESTINAL: No anorexia, nausea, vomiting or diarrhea. No abdominal pain or blood.  GENITOURINARY: No burning on urination, no polyuria NEUROLOGICAL: No headache, dizziness, syncope, paralysis, ataxia, numbness or tingling in the extremities. No change in bowel or bladder control.  MUSCULOSKELETAL: No muscle, back pain, joint pain or stiffness.  LYMPHATICS: No enlarged nodes. No history of splenectomy.  PSYCHIATRIC: No history of depression or anxiety.  ENDOCRINOLOGIC: No reports of sweating, cold or heat intolerance. No polyuria or polydipsia.  Marland Kitchen.   Physical Examination There were no vitals filed for this visit. There were no vitals filed for this visit.  Gen: resting comfortably, no acute distress HEENT: no scleral icterus, pupils equal round and reactive, no palptable cervical  adenopathy,  CV Resp: Clear to auscultation bilaterally GI: abdomen is soft, non-tender, non-distended, normal bowel sounds, no hepatosplenomegaly MSK: extremities are warm, no edema.  Skin: warm, no rash Neuro:  no focal deficits Psych: appropriate affect   Diagnostic Studies 08/2012 DSE  Baseline:  - LV, RV, and aortic size were normal. LA size was upper normal. AV and MV were normal. - LV global systolic function was normal. The estimated LV ejection fraction was 60%. - Normal wall motion; no LV regional wall motion abnormalities. Low dose:  - LV size was normal and appropriately decreased from the prior stage. - LV global systolic function was hyperdynamic and appropriately augmented from the prior stage. The estimated LV ejection fraction was 70%. -  Normal wall motion; no LV regional wall motion abnormalities. - No evidence for new LV regional wall motion abnormalities. Peak stress:  - LV size was normal, appropriately decreased from the prior stage, and appropriately decreased from baseline. - LV global systolic function was hyperdynamic, appropriately augmented from the prior stage, and appropriately augmented from baseline. The estimated LV ejection fraction was 75%. - There is no diffuse LV hypokinesis. - No evidence for new LV regional wall motion abnormalities.  ------------------------------------------------------------ Stress echo results: Left ventricular ejection fraction was normal at rest and with stress. There was no echocardiographic evidence for stress-induced ischemia.  07/2013 MPI Tomographic views were obtained using the short axis, vertical long  axis, and horizontal long axis planes. There is a small, mild  intensity mid to basal inferolateral defect that is predominantly  fixed with a partial degree of reversibility in the basal zone. This  likely represents variable soft tissue attenuation, cannot  completely exclude a mild degree of  ischemia.  Gated imaging reveals an EDV of 142, ESV of 60, LVEF of 58%, and no  focal wall motion abnormalities.  IMPRESSION:  Overall low risk Lexiscan Cardiolite. There were no diagnostic ST  segment abnormalities or arrhythmias. Perfusion imaging is  suggestive of variable soft tissue attenuation, particularly in the  diaphragmatic distribution. Cannot completely exclude a very small  region of ischemia in the mid to basal inferolateral wall. LVEF is  normal at 58% with normal volumes and wall motion.  05/2014 CXR IMPRESSION: Mild cardiomegaly. There is no pneumonia, CHF, nor other acute cardiopulmonary abnormality.  06/2015 Cardiac monitor  Telemetry strips show sinus rhythm  One episode of sinus bradycardia rates in 40s at 130AM, presumably while sleeping.  No significant arrhythmias  No symptoms reported  Assessment and Plan  1. Palpitations  - recent increase in symptoms. Beta blocker therapy limited due to bradycardia.  - with recent increase in paliptations and fatigue and prior history of both bradycardia and tachycardia will repeat event monitor  2. Chest pain  - long history of chest pain, previous negative stress tests. Now with mild nonexertional symptoms, continue to monitor  3. HTN  - elevated, will increase norvasc to  daily.   4. OSA screen + snoring, BMI 41 (severe obesity), some daytime somnolence, HTN, and cardiac arrhythmia. All are associated with possible OSA  - awaiting sleep study, will refer again to sleep medicine. Encouraged to make appointment.   6. Hyperlipidemia - continue current statin  7. Preoperative evaluation - patient scheduled for shoulder surgery. Given recent symptoms would recommend postoponing until further cardiac testing is complete   F/u 3-4 weeks      Antoine Poche, M.D., F.A.C.C.

## 2015-10-19 ENCOUNTER — Other Ambulatory Visit: Payer: Self-pay | Admitting: Cardiology

## 2016-03-04 ENCOUNTER — Other Ambulatory Visit: Payer: Self-pay | Admitting: Cardiology

## 2016-04-09 ENCOUNTER — Other Ambulatory Visit: Payer: Self-pay | Admitting: Cardiology

## 2016-07-28 ENCOUNTER — Other Ambulatory Visit: Payer: Self-pay | Admitting: Cardiology

## 2016-09-01 ENCOUNTER — Other Ambulatory Visit: Payer: Self-pay | Admitting: Cardiology

## 2016-10-15 ENCOUNTER — Other Ambulatory Visit: Payer: Self-pay | Admitting: Cardiology

## 2016-12-05 ENCOUNTER — Other Ambulatory Visit: Payer: Self-pay | Admitting: Cardiology

## 2017-02-05 ENCOUNTER — Other Ambulatory Visit: Payer: Self-pay | Admitting: Cardiology

## 2017-03-23 ENCOUNTER — Other Ambulatory Visit: Payer: Self-pay | Admitting: Cardiology

## 2017-09-22 ENCOUNTER — Other Ambulatory Visit: Payer: Self-pay | Admitting: Internal Medicine

## 2017-09-22 ENCOUNTER — Other Ambulatory Visit: Payer: Self-pay | Admitting: *Deleted

## 2017-09-22 DIAGNOSIS — R109 Unspecified abdominal pain: Secondary | ICD-10-CM

## 2017-09-29 ENCOUNTER — Ambulatory Visit
Admission: RE | Admit: 2017-09-29 | Discharge: 2017-09-29 | Disposition: A | Discharge status: Home / Self Care | Payer: Medicare Other | Source: Ambulatory Visit | Attending: *Deleted | Admitting: *Deleted

## 2017-09-29 DIAGNOSIS — R109 Unspecified abdominal pain: Secondary | ICD-10-CM

## 2017-10-07 ENCOUNTER — Other Ambulatory Visit: Payer: Self-pay | Admitting: *Deleted

## 2017-10-07 DIAGNOSIS — R1013 Epigastric pain: Secondary | ICD-10-CM

## 2017-10-13 ENCOUNTER — Other Ambulatory Visit: Payer: Medicare Other

## 2017-10-21 ENCOUNTER — Ambulatory Visit
Admission: RE | Admit: 2017-10-21 | Discharge: 2017-10-21 | Disposition: A | Discharge status: Home / Self Care | Payer: Medicare Other | Source: Ambulatory Visit | Attending: *Deleted | Admitting: *Deleted

## 2017-10-21 DIAGNOSIS — R1013 Epigastric pain: Secondary | ICD-10-CM

## 2017-10-21 MED ORDER — IOPAMIDOL (ISOVUE-300) INJECTION 61%
100.0000 mL | Freq: Once | INTRAVENOUS | Status: AC | PRN
  Administered 2017-10-21: 100 mL via INTRAVENOUS

## 2018-03-15 ENCOUNTER — Other Ambulatory Visit: Payer: Self-pay | Admitting: Cardiology

## 2018-03-22 ENCOUNTER — Other Ambulatory Visit: Payer: Self-pay | Admitting: *Deleted

## 2018-03-22 MED ORDER — ACEBUTOLOL HCL 200 MG PO CAPS
ORAL_CAPSULE | ORAL | 0 refills | Status: DC
Start: 2018-03-22 — End: 2021-10-31

## 2018-09-24 ENCOUNTER — Ambulatory Visit (HOSPITAL_COMMUNITY)
Admission: RE | Admit: 2018-09-24 | Discharge: 2018-09-24 | Disposition: A | Discharge status: Home / Self Care | Payer: Medicare Other | Source: Ambulatory Visit | Attending: Internal Medicine | Admitting: Internal Medicine

## 2018-09-24 ENCOUNTER — Other Ambulatory Visit (HOSPITAL_COMMUNITY): Payer: Self-pay | Admitting: Internal Medicine

## 2018-09-24 ENCOUNTER — Other Ambulatory Visit: Payer: Self-pay

## 2018-09-24 DIAGNOSIS — J189 Pneumonia, unspecified organism: Secondary | ICD-10-CM | POA: Diagnosis not present

## 2019-05-12 ENCOUNTER — Ambulatory Visit: Payer: Medicare Other | Attending: Internal Medicine

## 2019-05-12 DIAGNOSIS — Z23 Encounter for immunization: Secondary | ICD-10-CM | POA: Insufficient documentation

## 2019-05-12 NOTE — Progress Notes (Signed)
   Covid-19 Vaccination Clinic  Name:  David Crosby    MRN: [id number] DOB: 1966/02/07  05/12/2019  [name] was observed post Covid-19 immunization for 15 minutes without incident. He was provided with Vaccine Information Sheet and instruction to access the V-Safe system.   [name] was instructed to call 911 with any severe reactions post vaccine: Marland Kitchen Difficulty breathing  . Swelling of face and throat  . A fast heartbeat  . A bad rash all over body  . Dizziness and weakness   Immunizations Administered    Name Date Dose VIS Date Route   Moderna COVID-19 Vaccine 05/12/2019  8:28 AM 0.5 mL 02/08/2019 Intramuscular   Manufacturer: Moderna   Lot: [id number]   NDC: 88677-373-66

## 2019-06-14 ENCOUNTER — Ambulatory Visit: Payer: Medicare Other | Attending: Internal Medicine

## 2019-06-14 DIAGNOSIS — Z23 Encounter for immunization: Secondary | ICD-10-CM

## 2019-06-14 NOTE — Progress Notes (Signed)
   Covid-19 Vaccination Clinic  Name:  VENKAT ANKNEY    MRN: [id number] DOB: Aug 01, 1965  06/14/2019  Mr. Corti was observed post Covid-19 immunization for 15 minutes without incident. He was provided with Vaccine Information Sheet and instruction to access the V-Safe system.   Mr. Knauer was instructed to call 911 with any severe reactions post vaccine: Marland Kitchen Difficulty breathing  . Swelling of face and throat  . A fast heartbeat  . A bad rash all over body  . Dizziness and weakness   Immunizations Administered    Name Date Dose VIS Date Route   Moderna COVID-19 Vaccine 06/14/2019  8:17 AM 0.5 mL 02/08/2019 Intramuscular   Manufacturer: Moderna   Lot: [id number]   NDC: 73578-978-47

## 2020-08-30 ENCOUNTER — Ambulatory Visit: Payer: Medicare Other | Admitting: Orthopaedic Surgery

## 2020-08-30 ENCOUNTER — Other Ambulatory Visit: Payer: Self-pay

## 2021-10-29 DIAGNOSIS — R0602 Shortness of breath: Secondary | ICD-10-CM | POA: Insufficient documentation

## 2021-10-29 NOTE — Progress Notes (Unsigned)
Cardiology Office Note   Date:  10/31/2021   ID:  Jakson, Delpilar [birth year], MRN [id number]  PCP:  Rebekah Chesterfield, NP  Cardiologist:   Rollene Rotunda, MD Referring:  Rebekah Chesterfield, NP  Chief Complaint  Patient presents with   Palpitations      History of Present Illness: David Crosby is a 56 y.o. male who presents for evaluation of palpitations.  He was referred by Rebekah Chesterfield, NP  He saw Dr. Wyline Mood in 2017 for palpitations.  He had no significant arrhythmias on a monitor.  Prior to this in 2015 he had a negative Lexiscan Myoview.    He presents because a few weeks ago he was having palpitations.  This felt like his heart was skipping beats.  He was not describing sustained tachyarrhythmias.  He felt anxious.  It would go on all day.  He did not see a trigger for it.  He did not try anything to get rid of it.  It was a little bit similar to what he had before.  He was not having presyncope or syncope.  He was not having any chest discomfort, neck or arm discomfort.  However, about a week ago which is went away.  He is otherwise done okay.  Has been walking for exercise.  I just recently saw his father and sent him for bypass surgery and he has been trying to do a little bit more with his diet and exercise and has lost a few pounds.  He stays active walking.  He is not having any new shortness of breath, PND or orthopnea.         Past Medical History:  Diagnosis Date   Asthma    Cardiomegaly    Seen on chest x-ray   GERD (gastroesophageal reflux disease)    Gout    Hyperlipidemia    Hypertension    Hypothyroidism    Motion sickness    Nephritis    Palpitation    Pneumonia    hx of pneumonia several times    PONV (postoperative nausea and vomiting)    Renal insufficiency    Stage II kidney disease per [facility] OV note of 04/02/2015     Past Surgical History:  Procedure Laterality Date   CYSTOSCOPY WITH RETROGRADE PYELOGRAM,  URETEROSCOPY AND STENT PLACEMENT Left 05/23/2015   Procedure: CYSTOSCOPY, LEFT RETROGRADE PYELOGRAM, LEFT URETEROSCOPY,  BASKET EXTRACTION AND DOUBLE J STENT PLACEMENT LEFT URETERAL DILATION;  Surgeon: Crist Fat, MD;  Location: [facility];  Service: Urology;  Laterality: Left;   HOLMIUM LASER APPLICATION Left 05/23/2015   Procedure: HOLMIUM LASER LITHOTRIPSY ;  Surgeon: Crist Fat, MD;  Location: [facility];  Service: Urology;  Laterality: Left;   left foot surgery     Multiple   left forehead surgery      due to broken sinus cavity    LITHOTRIPSY Left 05/07/2015     Current Outpatient Medications  Medication Sig Dispense Refill   acebutolol (SECTRAL) 200 MG capsule TAKE 2 CAPSULES BY MOUTH IN THE MORNING AND 1 IN THE EVENING MUST  HAVE  OFFICE  VISIT  FOR  ADDITIONAL  REFILLS 30 capsule 0   albuterol (PROVENTIL HFA;VENTOLIN HFA) 108 (90 Base) MCG/ACT inhaler Inhale 1 puff into the lungs every 6 (six) hours as needed for wheezing or shortness of breath.     amLODipine (NORVASC) 5 MG tablet TAKE ONE TABLET BY MOUTH ONCE DAILY MUST  HAVE  OFFICE  VISIT  FOR  ADDITIONAL  REFILLS 30 tablet 0   aspirin 81 MG tablet Take 81 mg by mouth daily. Reported on 06/11/2015     febuxostat (ULORIC) 40 MG tablet Take 80 mg by mouth daily.      fluticasone furoate-vilanterol (BREO ELLIPTA) 200-25 MCG/INH AEPB Inhale 1 puff into the lungs as needed.      levothyroxine (SYNTHROID, LEVOTHROID) 100 MCG tablet Take 100 mcg by mouth daily before breakfast.     lisinopril (PRINIVIL,ZESTRIL) 20 MG tablet Take 1 tablet by mouth daily.      lovastatin (MEVACOR) 20 MG tablet Take 20 mg by mouth at bedtime.      phenazopyridine (PYRIDIUM) 200 MG tablet Take 1 tablet (200 mg total) by mouth 3 (three) times daily as needed for pain. 10 tablet 0   tamsulosin (FLOMAX) 0.4 MG CAPS capsule Take 1 capsule (0.4 mg total) by mouth daily after supper. (Patient taking differently: Take 0.4 mg by mouth daily after supper.) 30  capsule 0   traMADol (ULTRAM) 50 MG tablet Take 1-2 tablets (50-100 mg total) by mouth every 6 (six) hours as needed for moderate pain. 30 tablet 0   Trospium Chloride 60 MG CP24 Take 1 capsule (60 mg total) by mouth daily. 7 each 0   No current facility-administered medications for this visit.    Allergies:   Patient has no known allergies.    Social History:  The patient  reports that he has never smoked. He has never used smokeless tobacco. He reports that he does not drink alcohol and does not use drugs.   Family History:  The patient's family history includes Dementia in his mother; Heart attack in an other family member; Heart disease (age of onset: 51) in his father; Hypertension in an other family member.    ROS:  Please see the history of present illness.   Otherwise, review of systems are positive for none.   All other systems are reviewed and negative.    PHYSICAL EXAM: VS:  BP 122/70 (BP Location: Left Arm, Patient Position: Sitting, Cuff Size: Large)   Pulse 64   Ht 5\' 11"  (1.803 m)   Wt 273 lb (123.8 kg)   BMI 38.08 kg/m  , BMI Body mass index is 38.08 kg/m. GENERAL:  Well appearing HEENT:  Pupils equal round and reactive, fundi not visualized, oral mucosa unremarkable NECK:  No jugular venous distention, waveform within normal limits, carotid upstroke brisk and symmetric, no bruits, no thyromegaly LYMPHATICS:  No cervical, inguinal adenopathy LUNGS:  Clear to auscultation bilaterally BACK:  No CVA tenderness CHEST:  Unremarkable HEART:  PMI not displaced or sustained,S1 and S2 within normal limits, no S3, no S4, no clicks, no rubs, no murmurs ABD:  Flat, positive bowel sounds normal in frequency in pitch, no bruits, no rebound, no guarding, no midline pulsatile mass, no hepatomegaly, no splenomegaly EXT:  2 plus pulses throughout, no edema, no cyanosis no clubbing SKIN:  No rashes no nodules NEURO:  Cranial nerves II through XII grossly intact, motor grossly intact  throughout PSYCH:  Cognitively intact, oriented to person place and time    EKG:  EKG is ordered today. The ekg ordered today demonstrates sinus rhythm, rate 64, axis within normal limits, intervals within normal limits, no acute ST-T wave changes.   Recent Labs: No results found for requested labs within last 365 days.    Lipid Panel    Component Value Date/Time   CHOL 196 07/06/2013  0913   TRIG 205 (H) 07/06/2013 0913   HDL 35 (L) 07/06/2013 0913   CHOLHDL 5.6 07/06/2013 0913   VLDL 41 (H) 07/06/2013 0913   LDLCALC 120 (H) 07/06/2013 0913      Wt Readings from Last 3 Encounters:  10/31/21 273 lb (123.8 kg)  06/11/15 295 lb 3.2 oz (133.9 kg)  05/23/15 292 lb (132.5 kg)      Other studies Reviewed: Additional studies/ records that were reviewed today include: Labs. Review of the above records demonstrates:  Please see elsewhere in the note.     ASSESSMENT AND PLAN:  Palpitations : Since the patient is no longer having the symptoms I will put a monitor on him.  However, I advised him to get an  Alive Cor or another wearable.  I will check blood work to include TSH, CBC, electrolytes including magnesium.  HTN  : The blood pressure is controlled.  No change in therapy.  OSA screen: He has some snoring and somnolence but says he would not wear a mask and does not want to screening.  Hyperlipidemia: He has not had his lipids checked in a while.  I will check this.  The goal should be an LDL less than 100.  Current medicines are reviewed at length with the patient today.  The patient does not have concerns regarding medicines.  The following changes have been made:  no change  Labs/ tests ordered today include:   Orders Placed This Encounter  Procedures   CBC   Lipid panel   TSH   Magnesium   Comprehensive metabolic panel   EKG 12-Lead     Disposition:   FU with me as needed.      Signed, Rollene Rotunda, MD  10/31/2021 8:40 AM     [facility]

## 2021-10-31 ENCOUNTER — Ambulatory Visit (INDEPENDENT_AMBULATORY_CARE_PROVIDER_SITE_OTHER): Payer: Medicare Other | Admitting: Cardiology

## 2021-10-31 ENCOUNTER — Encounter: Payer: Self-pay | Admitting: Cardiology

## 2021-10-31 VITALS — BP 122/70 | HR 64 | Ht 71.0 in | Wt 273.0 lb

## 2021-10-31 DIAGNOSIS — R002 Palpitations: Secondary | ICD-10-CM

## 2021-10-31 DIAGNOSIS — R0602 Shortness of breath: Secondary | ICD-10-CM

## 2021-10-31 DIAGNOSIS — R072 Precordial pain: Secondary | ICD-10-CM

## 2021-10-31 DIAGNOSIS — I10 Essential (primary) hypertension: Secondary | ICD-10-CM | POA: Diagnosis not present

## 2021-10-31 DIAGNOSIS — E785 Hyperlipidemia, unspecified: Secondary | ICD-10-CM

## 2021-10-31 NOTE — Patient Instructions (Signed)
Medication Instructions:  Your physician recommends that you continue on your current medications as directed. Please refer to the Current Medication list given to you today.  *If you need a refill on your cardiac medications before your next appointment, please call your pharmacy*  Lab Work: Your physician recommends that you return for lab work TODAY:  CBC CMP FLP Magnesium TSH  If you have labs (blood work) drawn today and your tests are completely normal, you will receive your results only by: MyChart Message (if you have MyChart) OR A paper copy in the mail If you have any lab test that is abnormal or we need to change your treatment, we will call you to review the results.  Testing/Procedures: NONE ordered at this time of appointment   Follow-Up: At [facility], you and your health needs are our priority.  As part of our continuing mission to provide you with exceptional heart care, we have created designated Provider Care Teams.  These Care Teams include your primary Cardiologist (physician) and Advanced Practice Providers (APPs -  Physician Assistants and Nurse Practitioners) who all work together to provide you with the care you need, when you need it.  We recommend signing up for the patient portal called "MyChart".  Sign up information is provided on this After Visit Summary.  MyChart is used to connect with patients for Virtual Visits (Telemedicine).  Patients are able to view lab/test results, encounter notes, upcoming appointments, etc.  Non-urgent messages can be sent to your provider as well.   To learn more about what you can do with MyChart, go to [email or web address].    Your next appointment:   As needed    The format for your next appointment:   In Person  Provider:   Rollene Rotunda, MD    Other Instructions   Important Information About Sugar

## 2021-11-01 LAB — COMPREHENSIVE METABOLIC PANEL
ALT: 27 IU/L (ref 0–44)
AST: 19 IU/L (ref 0–40)
Albumin/Globulin Ratio: 1.7 (ref 1.2–2.2)
Albumin: 4.5 g/dL (ref 3.8–4.9)
Alkaline Phosphatase: 78 IU/L (ref 44–121)
BUN/Creatinine Ratio: 16 (ref 9–20)
BUN: 15 mg/dL (ref 6–24)
Bilirubin Total: 0.6 mg/dL (ref 0.0–1.2)
CO2: 21 mmol/L (ref 20–29)
Calcium: 8.8 mg/dL (ref 8.7–10.2)
Chloride: 101 mmol/L (ref 96–106)
Creatinine, Ser: 0.92 mg/dL (ref 0.76–1.27)
Globulin, Total: 2.7 g/dL (ref 1.5–4.5)
Glucose: 104 mg/dL — ABNORMAL HIGH (ref 70–99)
Potassium: 4.2 mmol/L (ref 3.5–5.2)
Sodium: 137 mmol/L (ref 134–144)
Total Protein: 7.2 g/dL (ref 6.0–8.5)
eGFR: 98 mL/min/{1.73_m2} (ref >59)

## 2021-11-01 LAB — LIPID PANEL
Chol/HDL Ratio: 5.1 ratio — ABNORMAL HIGH (ref 0.0–5.0)
Cholesterol, Total: 164 mg/dL (ref 100–199)
HDL: 32 mg/dL — ABNORMAL LOW (ref >39)
LDL Chol Calc (NIH): 103 mg/dL — ABNORMAL HIGH (ref 0–99)
Triglycerides: 167 mg/dL — ABNORMAL HIGH (ref 0–149)
VLDL Cholesterol Cal: 29 mg/dL (ref 5–40)

## 2021-11-01 LAB — CBC
Hematocrit: 44.7 % (ref 37.5–51.0)
Hemoglobin: 15.4 g/dL (ref 13.0–17.7)
MCH: 30.4 pg (ref 26.6–33.0)
MCHC: 34.5 g/dL (ref 31.5–35.7)
MCV: 88 fL (ref 79–97)
Platelets: 223 10*3/uL (ref 150–450)
RBC: 5.06 x10E6/uL (ref 4.14–5.80)
RDW: 12.7 % (ref 11.6–15.4)
WBC: 4.9 10*3/uL (ref 3.4–10.8)

## 2021-11-01 LAB — MAGNESIUM: Magnesium: 2 mg/dL (ref 1.6–2.3)

## 2021-11-01 LAB — TSH: TSH: 6.39 u[IU]/mL — ABNORMAL HIGH (ref 0.450–4.500)

## 2021-11-05 ENCOUNTER — Encounter: Payer: Self-pay | Admitting: *Deleted

## 2021-11-05 ENCOUNTER — Other Ambulatory Visit: Payer: Self-pay | Admitting: *Deleted

## 2021-11-05 DIAGNOSIS — R002 Palpitations: Secondary | ICD-10-CM

## 2021-11-05 DIAGNOSIS — E785 Hyperlipidemia, unspecified: Secondary | ICD-10-CM

## 2021-11-05 DIAGNOSIS — R7989 Other specified abnormal findings of blood chemistry: Secondary | ICD-10-CM

## 2021-11-22 LAB — T4, FREE: Free T4: 1.17 ng/dL (ref 0.82–1.77)

## 2021-11-25 ENCOUNTER — Encounter: Payer: Self-pay | Admitting: *Deleted

## 2022-02-18 ENCOUNTER — Encounter: Payer: Self-pay | Admitting: *Deleted

## 2022-02-27 ENCOUNTER — Telehealth: Payer: Self-pay

## 2022-02-27 NOTE — Telephone Encounter (Signed)
   Name: David Crosby  DOB: 05-12-1965  MRN: [id number]  Primary Cardiologist: Rollene Rotunda, MD   Preoperative team, please contact this patient and set up a phone call appointment for further preoperative risk assessment. Please obtain consent and complete medication review. Thank you for your help.  I confirm that guidance regarding antiplatelet and oral anticoagulation therapy has been completed and, if necessary, noted below.  None requested .    Ronney Asters, NP 02/27/2022, 2:20 PM [facility]

## 2022-02-27 NOTE — Telephone Encounter (Signed)
   Pre-operative Risk Assessment    Patient Name: David Crosby  DOB: Nov 05, 1965 MRN: [id number]      Request for Surgical Clearance    Procedure:   RT SHOULDER SCOPE W/RCR  Date of Surgery:  04/18/2018                                Surgeon:  DR. Caryn Bee SUPPLE Surgeon's Group or Practice Name:  [facility] Phone number:  [phone] Fax number:  [phone] ATTN: Aida Raider   Type of Clearance Requested:   - Medical    Type of Anesthesia:  General    Additional requests/questions:    SignedChana Bode   02/27/2022, 12:02 PM

## 2022-02-27 NOTE — Telephone Encounter (Signed)
1st attempt to reach pt.  No voicemail set up.

## 2022-02-28 ENCOUNTER — Telehealth: Payer: Self-pay | Admitting: *Deleted

## 2022-02-28 NOTE — Telephone Encounter (Signed)
Pt scheduled for tele pre op appt 04/07/22 @ 9 am. Med rec and consent are done.     Patient Consent for Virtual Visit        [name] has provided verbal consent on 02/28/2022 for a virtual visit (video or telephone).   CONSENT FOR VIRTUAL VISIT FOR:  David Crosby  By participating in this virtual visit I agree to the following:  I hereby voluntarily request, consent and authorize [facility] and its employed or contracted physicians, physician assistants, nurse practitioners or other licensed health care professionals (the Practitioner), to provide me with telemedicine health care services (the "Services") as deemed necessary by the treating Practitioner. I acknowledge and consent to receive the Services by the Practitioner via telemedicine. I understand that the telemedicine visit will involve communicating with the Practitioner through live audiovisual communication technology and the disclosure of certain medical information by electronic transmission. I acknowledge that I have been given the opportunity to request an in-person assessment or other available alternative prior to the telemedicine visit and am voluntarily participating in the telemedicine visit.  I understand that I have the right to withhold or withdraw my consent to the use of telemedicine in the course of my care at any time, without affecting my right to future care or treatment, and that the Practitioner or I may terminate the telemedicine visit at any time. I understand that I have the right to inspect all information obtained and/or recorded in the course of the telemedicine visit and may receive copies of available information for a reasonable fee.  I understand that some of the potential risks of receiving the Services via telemedicine include:  Delay or interruption in medical evaluation due to technological equipment failure or disruption; Information transmitted may not be sufficient (e.g. poor  resolution of images) to allow for appropriate medical decision making by the Practitioner; and/or  In rare instances, security protocols could fail, causing a breach of personal health information.  Furthermore, I acknowledge that it is my responsibility to provide information about my medical history, conditions and care that is complete and accurate to the best of my ability. I acknowledge that Practitioner's advice, recommendations, and/or decision may be based on factors not within their control, such as incomplete or inaccurate data provided by me or distortions of diagnostic images or specimens that may result from electronic transmissions. I understand that the practice of medicine is not an exact science and that Practitioner makes no warranties or guarantees regarding treatment outcomes. I acknowledge that a copy of this consent can be made available to me via my patient portal [facility] MyChart), or I can request a printed copy by calling the office of [facility].    I understand that my insurance will be billed for this visit.   I have read or had this consent read to me. I understand the contents of this consent, which adequately explains the benefits and risks of the Services being provided via telemedicine.  I have been provided ample opportunity to ask questions regarding this consent and the Services and have had my questions answered to my satisfaction. I give my informed consent for the services to be provided through the use of telemedicine in my medical care

## 2022-02-28 NOTE — Telephone Encounter (Signed)
Pt scheduled for tele pre op appt 04/07/22 @ 9 am. Med rec and consent are done.

## 2022-03-31 NOTE — Progress Notes (Unsigned)
Virtual Visit via Telephone Note   Because of David Crosby's co-morbid illnesses, he is at least at moderate risk for complications without adequate follow up.  This format is felt to be most appropriate for this patient at this time.  The patient did not have access to video technology/had technical difficulties with video requiring transitioning to audio format only (telephone).  All issues noted in this document were discussed and addressed.  No physical exam could be performed with this format.  Please refer to the patient's chart for his consent to telehealth for David Crosby.  Evaluation Performed:  Preoperative cardiovascular risk assessment _____________   Date:  05/02/2022   Patient ID:  David Crosby, DOB Sep 13, 1965, MRN [id number] Patient Location:  Home Provider location:   Office  Primary Care Provider:  [name], NP Primary Cardiologist:  David Breeding, MD  Chief Complaint / Patient Profile   57 y.o. y/o male with a h/o palpitations, gout, GERD, HTN, HLD, and obesity who is pending right shoulder scope with RCR and presents today for telephonic preoperative cardiovascular risk assessment.  History of Present Illness    David Crosby is a 57 y.o. male who presents via audio/video conferencing for a telehealth visit today.  Pt was last seen in cardiology clinic on 10/31/21 by Dr. Percival Crosby.  At that time David Crosby was doing well.  The patient is now pending procedure as outlined above. Since his last visit, he *** denies chest pain, shortness of breath, lower extremity edema, fatigue, palpitations, melena, hematuria, hemoptysis, diaphoresis, weakness, presyncope, syncope, orthopnea, and PND.   Past Medical History    Past Medical History:  Diagnosis Date  . Asthma   . Cardiomegaly    Seen on chest x-ray  . GERD (gastroesophageal reflux disease)   . Gout   . Hyperlipidemia   . Hypertension   . Hypothyroidism   . Motion sickness    . Nephritis   . Palpitation   . Pneumonia    hx of pneumonia several times   . PONV (postoperative nausea and vomiting)   . Renal insufficiency    Stage II kidney disease per [facility] note of 04/02/2015    Past Surgical History:  Procedure Laterality Date  . CYSTOSCOPY WITH RETROGRADE PYELOGRAM, URETEROSCOPY AND STENT PLACEMENT Left 05/23/2015   Procedure: CYSTOSCOPY, LEFT RETROGRADE PYELOGRAM, LEFT URETEROSCOPY,  BASKET EXTRACTION AND DOUBLE J STENT PLACEMENT LEFT URETERAL DILATION;  Surgeon: David Hughs, MD;  Location: [facility];  Service: Urology;  Laterality: Left;  . HOLMIUM LASER APPLICATION Left [id number]   Procedure: HOLMIUM LASER LITHOTRIPSY ;  Surgeon: David Hughs, MD;  Location: [facility];  Service: Urology;  Laterality: Left;  . left foot surgery     Multiple  . left forehead surgery      due to broken sinus cavity   . LITHOTRIPSY Left 05/07/2015    Allergies  No Known Allergies  Home Medications    Prior to Admission medications   Medication Sig Start Date End Date Taking? Authorizing Provider  acebutolol (SECTRAL) 200 MG capsule TAKE 2 CAPSULES BY MOUTH IN THE MORNING AND 1 IN THE EVENING MUST  HAVE  OFFICE  VISIT  FOR  ADDITIONAL  REFILLS 02/05/17   David Lenis, MD  albuterol (PROVENTIL HFA;VENTOLIN HFA) 108 (90 Base) MCG/ACT inhaler Inhale 1 puff into the lungs every 6 (six) hours as needed for wheezing or shortness of breath.    [provider]  amLODipine (NORVASC) 5 MG tablet TAKE ONE TABLET BY MOUTH ONCE DAILY MUST  HAVE  OFFICE  VISIT  FOR  ADDITIONAL  REFILLS 07/28/16   David Lenis, MD  aspirin 81 MG tablet Take 81 mg by mouth daily. Reported on 06/11/2015    [provider]  febuxostat (ULORIC) 40 MG tablet Take 80 mg by mouth daily.     [provider]  fluticasone furoate-vilanterol (BREO ELLIPTA) 200-25 MCG/INH AEPB Inhale 1 puff into the lungs as needed.     [provider]  levothyroxine  (SYNTHROID, LEVOTHROID) 100 MCG tablet Take 100 mcg by mouth daily before breakfast.    [provider]  lisinopril (PRINIVIL,ZESTRIL) 20 MG tablet Take 1 tablet by mouth daily.  04/13/14   [provider]  lovastatin (MEVACOR) 20 MG tablet Take 20 mg by mouth at bedtime.     [provider]  phenazopyridine (PYRIDIUM) 200 MG tablet Take 1 tablet (200 mg total) by mouth 3 (three) times daily as needed for pain. Patient not taking: Reported on 02/28/2022 05/23/15   David Hughs, MD  tamsulosin (FLOMAX) 0.4 MG CAPS capsule Take 1 capsule (0.4 mg total) by mouth daily after supper. Patient taking differently: Take 0.4 mg by mouth daily after supper. 05/07/15   David Rhodes, MD  traMADol (ULTRAM) 50 MG tablet Take 1-2 tablets (50-100 mg total) by mouth every 6 (six) hours as needed for moderate pain. 05/23/15   David Hughs, MD  Trospium Chloride 60 MG CP24 Take 1 capsule (60 mg total) by mouth daily. 05/23/15   David Hughs, MD    Physical Exam    Vital Signs:  David Crosby does not have vital signs available for review today.***  Given telephonic nature of communication, physical exam is limited. AAOx3. NAD. Normal affect.  Speech and respirations are unlabored.  Accessory Clinical Findings    None  Assessment & Plan    1.  Preoperative Cardiovascular Risk Assessment:  The patient was advised that if he develops new symptoms prior to surgery to contact our office to arrange for a follow-up visit, and he verbalized understanding.    A copy of this note will be routed to requesting surgeon.  Time:   Today, I have spent *** minutes with the patient with telehealth technology discussing medical history, symptoms, and management plan.     David Life, NP  05/02/2022, 12:01 PM  This encounter was created in error - please disregard.

## 2022-04-03 ENCOUNTER — Telehealth: Payer: Self-pay | Admitting: Cardiology

## 2022-04-03 NOTE — Telephone Encounter (Signed)
Pt c/o Shortness Of Breath: STAT if SOB developed within the last 24 hours or pt is noticeably SOB on the phone  1. Are you currently SOB (can you hear that pt is SOB on the phone)? No  2. How long have you been experiencing SOB? 3-4 days  3. Are you SOB when sitting or when up moving around? Moving around  4. Are you currently experiencing any other symptoms? Left arm and fingers tingling and numb

## 2022-04-03 NOTE — Telephone Encounter (Addendum)
Patient stated when he walked 2 aisles in the grocery store yesterday, he had SOB that lasted about an hour after resting. He stated he has a cold. Denies fever, and his cough is nonproductive. He denies edema and stated his shoes and clothes fit the same. Denies chest pain/discomfort. He is out of his inhaler. Recommended he contact PCP for a refill. BP yesterday 158/85. This AM P 62, sat 91%. He also reports on & off numbness/tingling in left hand. No resent strain or injury. He is right-handed. Please advise on SOB and left hand. Has APP appointment on 1/29.

## 2022-04-03 NOTE — Telephone Encounter (Signed)
Pt would like a callback regarding upcoming appt on 1/29. He would like to know if appt can be changed to an office visit instead of Telephone visit. Please advise

## 2022-04-03 NOTE — Telephone Encounter (Signed)
S/w wanted to be seen in person instead of tele visit.  Canceled tele visit and scheduled pt will Coletta Memos, NP on Tuesday, February 2.

## 2022-04-07 ENCOUNTER — Ambulatory Visit: Payer: Medicare Other

## 2022-04-07 DIAGNOSIS — Z01810 Encounter for preprocedural cardiovascular examination: Secondary | ICD-10-CM

## 2022-04-08 ENCOUNTER — Ambulatory Visit: Payer: Medicare Other | Attending: General Practice | Admitting: Student

## 2022-04-08 ENCOUNTER — Encounter: Payer: Self-pay | Admitting: Student

## 2022-04-08 VITALS — BP 142/82 | HR 66 | Ht 70.0 in | Wt 286.8 lb

## 2022-04-08 DIAGNOSIS — R002 Palpitations: Secondary | ICD-10-CM

## 2022-04-08 DIAGNOSIS — E782 Mixed hyperlipidemia: Secondary | ICD-10-CM | POA: Diagnosis present

## 2022-04-08 DIAGNOSIS — Z01810 Encounter for preprocedural cardiovascular examination: Secondary | ICD-10-CM

## 2022-04-08 DIAGNOSIS — R0609 Other forms of dyspnea: Secondary | ICD-10-CM | POA: Diagnosis present

## 2022-04-08 DIAGNOSIS — I10 Essential (primary) hypertension: Secondary | ICD-10-CM | POA: Diagnosis present

## 2022-04-08 MED ORDER — AMLODIPINE BESYLATE 5 MG PO TABS: 7.5000 mg | ORAL_TABLET | Freq: Every day | ORAL | 3 refills | Status: DC

## 2022-04-08 MED ORDER — ROSUVASTATIN CALCIUM 10 MG PO TABS: 10.0000 mg | ORAL_TABLET | Freq: Every day | ORAL | 3 refills | Status: DC

## 2022-04-08 NOTE — Patient Instructions (Signed)
Medication Instructions:  STOP LOVASTATIN  START ROSUVASTATIN 10MG   DAILY  INCREASE AMLODIPINE 7.5MG  DAILY (1-1/2 TAB) *If you need a refill on your cardiac medications before your next appointment, please call your pharmacy*  Lab Work: [place] If you have labs (blood work) drawn today and your tests are completely normal, you will receive your results only by: [place] (if you have MyChart) OR A paper copy in the mail If you have any lab test that is abnormal or we need to change your treatment, we will call you to review the results.  Testing/Procedures: Echocardiogram - Your physician has requested that you have an echocardiogram. Echocardiography is a painless test that uses sound waves to create images of your heart. It provides your doctor with information about the size and shape of your heart and how well your heart's chambers and valves are working. This procedure takes approximately one hour. There are no restrictions for this procedure.    LEXISCAN=Your physician has requested that you have a [employer], is a chemical stress test. Please follow instruction sheet, as given.  The Myoview Stress Test is a diagnostic test to evaluate/detect the presence of early heart disease, to assess your functional capacity, or to update the status of your coronary circulation following a cardiac event.   Follow-Up: At [facility], you and your health needs are our priority.  As part of our continuing mission to provide you with exceptional heart care, we have created designated Provider Care Teams.  These Care Teams include your primary Cardiologist (physician) and Advanced Practice Providers (APPs -  Physician Assistants and Nurse Practitioners) who all work together to provide you with the care you need, when you need it.  We recommend signing up for the patient portal called "MyChart".  Sign up information is provided on this After Visit Summary.  MyChart is used  to connect with patients for Virtual Visits (Telemedicine).  Patients are able to view lab/test results, encounter notes, upcoming appointments, etc.  Non-urgent messages can be sent to your provider as well.   To learn more about what you can do with MyChart, go to [email or web address].    Your next appointment:   AFTER TESTING   Provider:   [name], MD  or Coletta Memos, FNP or Mayra Reel, NP       Other Instructions

## 2022-04-08 NOTE — Progress Notes (Unsigned)
Cardiology Clinic Note   Patient Name: David Crosby Date of Encounter: 04/08/2022  Primary Care Provider:  [name], NP Primary Cardiologist:  [name], MD  Patient Profile    David Crosby is a 57 y.o. male with a past medical history of palpitations, hypertension, hyperlipidemia, GERD who presents to the clinic today for cardiac risk assessment for upcoming surgery.   Past Medical History    Past Medical History:  Diagnosis Date   Asthma    Cardiomegaly    Seen on chest x-ray   GERD (gastroesophageal reflux disease)    Gout    Hyperlipidemia    Hypertension    Hypothyroidism    Motion sickness    Nephritis    Palpitation    Pneumonia    hx of pneumonia several times    PONV (postoperative nausea and vomiting)    Renal insufficiency    Stage II kidney disease per [facility] note of 04/02/2015    Past Surgical History:  Procedure Laterality Date   CYSTOSCOPY WITH RETROGRADE PYELOGRAM, URETEROSCOPY AND STENT PLACEMENT Left 05/23/2015   Procedure: CYSTOSCOPY, LEFT RETROGRADE PYELOGRAM, LEFT URETEROSCOPY,  BASKET EXTRACTION AND DOUBLE J STENT PLACEMENT LEFT URETERAL DILATION;  Surgeon: Ardis Hughs, MD;  Location: [facility];  Service: Urology;  Laterality: Left;   HOLMIUM LASER APPLICATION Left 06/16/8117   Procedure: HOLMIUM LASER LITHOTRIPSY ;  Surgeon: Ardis Hughs, MD;  Location: [facility];  Service: Urology;  Laterality: Left;   left foot surgery     Multiple   left forehead surgery      due to broken sinus cavity    LITHOTRIPSY Left 05/07/2015    Allergies  No Known Allergies  History of Present Illness    David Crosby has a past medical history of: Palpitations.  7-day event monitor 06/19/2015: Sinus rhythm. One episode of bradycardia with rates in 40s at 1:30 am (presumably while sleeping). No significant arrhythmias. No symptoms reported.  Hypertension.  Hyperlipidemia.  Lipid panel 10/31/2021: LDL 103, HDL 32, TG  167, total 164.  GERD.  Hypothyroidism.   David Crosby was first evaluated by Dr. Fletcher Anon on 05/20/2012 after hospitalization at [facility] for palpitations.  During hospitalization telemetry showed no arrhythmia and echo showed normal LV function, mild LVH and no significant valvular abnormalities or pulmonary hypertension.  Holter monitor showed PVCs, PACs and a brief run of A. tach as well as a 3-second pause in early morning (4 AM).  Patient was again evaluated in the office by Dr. Carlyle Dolly in 2015 and 2016 for palpitations and DOE.  Nuclear stress test May 2015 showed variable soft tissue attenuation particularly in the diaphragmatic distribution and could not completely exclude a very small region of ischemia in the mid to basal inferolateral wall.  His last follow-up with Dr. Harl Bowie was May 2017 for complaints of increased palpitations. Event monitor as above.   Patient was last seen in the office by Dr. [name] on 10/31/2021 for increased palpitations. Labs were checked and showed elevated TSH and normal free T4. Electrolytes and magnesium were normal. It was suggested patient obtain a kardia mobile or other type of monitoring device for his palpitations.   Patient is scheduled today (04/07/2022) for cardiac risk assessment for upcoming right shoulder surgery.  He was initially scheduled for video visit however he contacted the office on 04/03/2022 with complaints of DOE while walking in the grocery store taking approximately 1 hour of rest to resolve.  He reported he has cold symptoms with a nonproductive cough and no fever.  He also reported numbness and tingling down left arm.  Today, patient reports a one month history of dyspnea with light exertion that has been getting worse over the last week or two. He has no shortness of breath at rest. He states at night he hears "rattling" in his chest that is not there during the day. He has been treating his symptoms with over the counter  Alka-Seltzer cold medication at night but it has not helped.  He has been out of his Breo inhaler for approximately 2 weeks prior to this he was using it as instructed and it was not helping his symptoms.  He denies fever, chills, nasal congestion, or cough.  He is a side sleeper and has been able to sleep in his usual manner without PND. No lower extremity edema or abdominal bloating/fullness. He has not been able to participate in his usual walking program for the last month. He reports he is even getting short of breath showering and needs to rest to catch his breath. He denies chest pain. He reports numbness of first three fingers on left hand that extends up left forearm to elbow at night. He does not have a history of neck issues. His upcoming surgery is for a torn ligament of his right shoulder. Discussed that numbness in fingers and forearm appear unrelated to symptoms of DOE. Encouraged him to reach out to PCP or ortho regarding these symptoms.    Home Medications    Current Meds  Medication Sig   acebutolol (SECTRAL) 200 MG capsule TAKE 2 CAPSULES BY MOUTH IN THE MORNING AND 1 IN THE EVENING MUST  HAVE  OFFICE  VISIT  FOR  ADDITIONAL  REFILLS   albuterol (PROVENTIL HFA;VENTOLIN HFA) 108 (90 Base) MCG/ACT inhaler Inhale 1 puff into the lungs every 6 (six) hours as needed for wheezing or shortness of breath.   aspirin 81 MG tablet Take 81 mg by mouth daily. Reported on 06/11/2015   febuxostat (ULORIC) 40 MG tablet Take 80 mg by mouth daily.    fluticasone furoate-vilanterol (BREO ELLIPTA) 200-25 MCG/INH AEPB Inhale 1 puff into the lungs as needed.    levothyroxine (SYNTHROID, LEVOTHROID) 100 MCG tablet Take 100 mcg by mouth daily before breakfast.   lisinopril (PRINIVIL,ZESTRIL) 20 MG tablet Take 1 tablet by mouth daily.    phenazopyridine (PYRIDIUM) 200 MG tablet Take 1 tablet (200 mg total) by mouth 3 (three) times daily as needed for pain.   rosuvastatin (CRESTOR) 10 MG tablet Take 1 tablet  (10 mg total) by mouth daily.   tamsulosin (FLOMAX) 0.4 MG CAPS capsule Take 1 capsule (0.4 mg total) by mouth daily after supper. (Patient taking differently: Take 0.4 mg by mouth daily after supper.)   traMADol (ULTRAM) 50 MG tablet Take 1-2 tablets (50-100 mg total) by mouth every 6 (six) hours as needed for moderate pain.   Trospium Chloride 60 MG CP24 Take 1 capsule (60 mg total) by mouth daily.   [DISCONTINUED] amLODipine (NORVASC) 5 MG tablet TAKE ONE TABLET BY MOUTH ONCE DAILY MUST  HAVE  OFFICE  VISIT  FOR  ADDITIONAL  REFILLS   [DISCONTINUED] lovastatin (MEVACOR) 20 MG tablet Take 20 mg by mouth at bedtime.     Family History    Family History  Problem Relation Age of Onset   Dementia Mother    Heart disease Father 57   Heart attack Other  Paternal and Maternal grandmother   Hypertension Other        Several family member   He indicated that his mother is alive. He indicated that his father is alive.   Social History    Social History   Socioeconomic History   Marital status: Legally Separated    Spouse name: Not on file   Number of children: Not on file   Years of education: Not on file   Highest education level: Not on file  Occupational History   Not on file  Tobacco Use   Smoking status: Never   Smokeless tobacco: Never  Substance and Sexual Activity   Alcohol use: No    Alcohol/week: 0.0 standard drinks of alcohol   Drug use: No   Sexual activity: Never  Other Topics Concern   Not on file  Social History Narrative   Lives at home with son.     Social Determinants of Health   Financial Resource Strain: Not on file  Food Insecurity: Not on file  Transportation Needs: Not on file  Physical Activity: Not on file  Stress: Not on file  Social Connections: Not on file  Intimate Partner Violence: Not on file     Review of Systems    General:  No chills, fever, night sweats or weight changes.  Cardiovascular:  No chest pain, edema, orthopnea,  palpitations, paroxysmal nocturnal dyspnea. Positive for worsening DOE.  Dermatological: No rash, lesions/masses Respiratory: No cough, no shortness of breath at rest. Urologic: No hematuria, dysuria Abdominal:   No nausea, vomiting, diarrhea, bright red blood per rectum, melena, or hematemesis Neurologic:  No visual changes, weakness, changes in mental status. All other systems reviewed and are otherwise negative except as noted above.  Physical Exam    VS:  BP (!) 142/80 (BP Location: Left Arm, Patient Position: Sitting, Cuff Size: Large)   Pulse 66   Ht 5\' 10"  (1.778 m)   Wt 286 lb 12.8 oz (130.1 kg)   BMI 41.15 kg/m  , BMI Body mass index is 41.15 kg/m. GEN:  Well nourished, well developed, in no acute distress. HEENT: Normal. Neck: Supple, no JVD, carotid bruits, or masses. Cardiac: RRR, no murmurs, rubs, or gallops. No clubbing, cyanosis, edema.  Radials/DP/PT 2+ and equal bilaterally.  Respiratory:  Respirations regular and unlabored, clear to auscultation bilaterally. GI: Soft, nontender, nondistended. MS: No deformity or atrophy. Skin: Warm and dry, no rash. Neuro: Strength and sensation are intact. Psych: Normal affect.  Accessory Clinical Findings   Recent Labs: 10/31/2021: ALT 27; BUN 15; Creatinine, Ser 0.92; Hemoglobin 15.4; Magnesium 2.0; Platelets 223; Potassium 4.2; Sodium 137; TSH 6.390   Recent Lipid Panel    Component Value Date/Time   CHOL 164 10/31/2021 0906   TRIG 167 (H) 10/31/2021 0906   HDL 32 (L) 10/31/2021 0906   CHOLHDL 5.1 (H) 10/31/2021 0906   CHOLHDL 5.6 07/06/2013 0913   VLDL 41 (H) 07/06/2013 0913   LDLCALC 103 (H) 10/31/2021 0906    HYPERTENSION CONTROL Vitals:   04/08/22 1318 04/08/22 1517  BP: (!) 142/80 (!) 142/82    The patient's blood pressure is elevated above target today. {Click here if intervention needs to be changed Refresh Note :1}  In order to address the patient's elevated BP: A current anti-hypertensive medication  was adjusted today.     ECG personally reviewed by me today: NSR, rate 66 bpm, nonspecific ST abnormality.  No significant changes from 10/31/2021.    Assessment & Plan  DOE. One month history of worsening dyspnea with light exertion. No shortness of breath at rest. He reports hearing "rattling" in lungs at night that is not there during the day. Denies associated chest pain. No fever, chills, nasal congestion or cough. He has been out of his Breo inhaler for approximately two weeks but did not feel it was helping his symptoms prior to running out. Breath sounds are clear bilaterally. Will order CBC, BMP, nuclear stress test and echo.  Shared Decision Making/Informed Consent{ All outpatient stress tests require an informed consent ([id number]) ATTESTATION ORDER       :[id number]} The risks [chest pain, shortness of breath, cardiac arrhythmias, dizziness, blood pressure fluctuations, myocardial infarction, stroke/transient ischemic attack, nausea, vomiting, allergic reaction, radiation exposure, metallic taste sensation and life-threatening complications (estimated to be 1 in 10,000)], benefits (risk stratification, diagnosing coronary artery disease, treatment guidance) and alternatives of a nuclear stress test were discussed in detail with David Crosby and he agrees to proceed. Palpitations. Event monitor April 2017 showed no significant arrhythmias. Patient reports palpitations are unchanged from previous. Continue acebutolol.  Hypertension. BP today 142/80 at intake and 142/82 at recheck. Patient denies dizziness or headaches. Continue acebutolol and lisinopril. Increase amlodipine to 7.5 mg daily.  Hyperlipidemia. LDL August 2023 101, not at goal. Patient will discontinue lovastatin and start rosuvastatin 10 mg daily Preoperative cardiovascular risk assessment. Chart reviewed as part of pre-operative protocol coverage. Patient does not have a history of ischemic heart disease, PCI, heart failure, or  stroke. According to the RCRI, patient has a 0.4% risk of MACE. Patient reports he cannot achieve activity equivalent to 4.0 METS for the last month secondary to worsening exertional dyspnea. Patient's risk cannot be finalized given worsening DOE (see #1). Will await results of echo and nuclear stress test. Patient is in agreement with plan to undergo testing. Since his surgery is scheduled for next Friday, 04/18/2022, he will contact the surgeon's office to reschedule. Patient will follow-up in the office after testing is complete.      Disposition: Nuclear stress test and echo. CBC and BMP today. Increase amlodipine to 7.5 mg. Stop Lovastatin and begin rosuvastatin 10 mg daily. Return when testing is completed.    Justice Britain. Kymia Simi, DNP, NP-C     04/08/2022, 2:22 PM [address] Office [phone] Fax [phone]

## 2022-04-09 ENCOUNTER — Telehealth: Payer: Self-pay | Admitting: Cardiology

## 2022-04-09 LAB — CBC
Hematocrit: 45.5 % (ref 37.5–51.0)
Hemoglobin: 16 g/dL (ref 13.0–17.7)
MCH: 32.3 pg (ref 26.6–33.0)
MCHC: 35.2 g/dL (ref 31.5–35.7)
MCV: 92 fL (ref 79–97)
Platelets: 263 10*3/uL (ref 150–450)
RBC: 4.95 x10E6/uL (ref 4.14–5.80)
RDW: 12.1 % (ref 11.6–15.4)
WBC: 6.8 10*3/uL (ref 3.4–10.8)

## 2022-04-09 LAB — BASIC METABOLIC PANEL
BUN/Creatinine Ratio: 14 (ref 9–20)
BUN: 17 mg/dL (ref 6–24)
CO2: 25 mmol/L (ref 20–29)
Calcium: 9.5 mg/dL (ref 8.7–10.2)
Chloride: 100 mmol/L (ref 96–106)
Creatinine, Ser: 1.18 mg/dL (ref 0.76–1.27)
Glucose: 166 mg/dL — ABNORMAL HIGH (ref 70–99)
Potassium: 4 mmol/L (ref 3.5–5.2)
Sodium: 141 mmol/L (ref 134–144)
eGFR: 72 mL/min/{1.73_m2} (ref >59)

## 2022-04-09 NOTE — Telephone Encounter (Signed)
Attempted to call patient. No VM set up 

## 2022-04-09 NOTE — Telephone Encounter (Signed)
Patient returned RN's call. 

## 2022-04-10 NOTE — Telephone Encounter (Signed)
TRIED TO CALL PT AGAIN, PHONE JUST KEEPS RINGING=NO VOICE MAIL

## 2022-04-14 NOTE — Telephone Encounter (Signed)
Left message on voicemail in reference to upcoming appointment scheduled for 04/16/22 Phone number given for a call back so details instructions can be given.  Boone Gear Jacqueline, RN   

## 2022-04-16 ENCOUNTER — Ambulatory Visit (HOSPITAL_COMMUNITY): Payer: Medicare Other | Attending: Student

## 2022-04-16 DIAGNOSIS — R0609 Other forms of dyspnea: Secondary | ICD-10-CM | POA: Diagnosis present

## 2022-04-16 MED ORDER — REGADENOSON 0.4 MG/5ML IV SOLN
0.4000 mg | Freq: Once | INTRAVENOUS | Status: AC
  Administered 2022-04-16: 0.4 mg via INTRAVENOUS

## 2022-04-16 MED ORDER — TECHNETIUM TC 99M TETROFOSMIN IV KIT
32.0000 | PACK | Freq: Once | INTRAVENOUS | Status: AC | PRN
  Administered 2022-04-16: 32 via INTRAVENOUS

## 2022-04-17 ENCOUNTER — Ambulatory Visit (HOSPITAL_COMMUNITY): Payer: Medicare Other | Attending: Student

## 2022-04-17 LAB — MYOCARDIAL PERFUSION IMAGING
LV dias vol: 122 mL (ref 62–150)
LV sys vol: 60 mL
Nuc Stress EF: 51 %
Peak HR: 67 {beats}/min
Rest HR: 57 {beats}/min
Rest Nuclear Isotope Dose: 32.5 mCi
SDS: 0
SRS: 2
SSS: 2
ST Depression (mm): 0 mm
Stress Nuclear Isotope Dose: 32 mCi
TID: 0.89

## 2022-04-17 MED ORDER — TECHNETIUM TC 99M TETROFOSMIN IV KIT
32.5000 | PACK | Freq: Once | INTRAVENOUS | Status: AC | PRN
  Administered 2022-04-17: 32.5 via INTRAVENOUS

## 2022-04-30 ENCOUNTER — Telehealth: Payer: Self-pay | Admitting: Cardiology

## 2022-04-30 NOTE — Telephone Encounter (Signed)
New Message:      Patient wants to know if he still needs the Echo tomorrow since his Stress Test was okay on 04-17-22. He said he thought the nurse told him on last Thursday, he did not need the Echo.

## 2022-04-30 NOTE — Telephone Encounter (Signed)
Called, spoke with sister (okay per DPR) advised upcoming testing was ECHO. Gave date and time. Patient sister verbalized understanding, thankful for call back.,

## 2022-04-30 NOTE — Telephone Encounter (Signed)
Patient verbalized understanding that per result on stress test he should have ECHO done.

## 2022-04-30 NOTE — Telephone Encounter (Signed)
Calling with questions bout he his procedure on tomorrow. Please advise

## 2022-05-01 ENCOUNTER — Encounter (HOSPITAL_COMMUNITY): Payer: Self-pay | Admitting: Student

## 2022-05-01 ENCOUNTER — Other Ambulatory Visit (HOSPITAL_COMMUNITY): Payer: Medicare Other

## 2022-05-09 ENCOUNTER — Ambulatory Visit: Payer: Medicare Other | Admitting: General Practice

## 2022-05-29 ENCOUNTER — Ambulatory Visit (HOSPITAL_COMMUNITY): Payer: Medicare Other | Attending: Student

## 2022-05-29 DIAGNOSIS — R0609 Other forms of dyspnea: Secondary | ICD-10-CM | POA: Insufficient documentation

## 2022-05-29 LAB — ECHOCARDIOGRAM COMPLETE
Area-P 1/2: 3.83 cm2
S' Lateral: 3.2 cm

## 2022-06-18 NOTE — Progress Notes (Signed)
Cardiology Clinic Note   Patient Name: David Crosby Date of Encounter: 06/20/2022  Primary Care Provider:  Rebekah Chesterfield, NP Primary Cardiologist:  Rollene Rotunda, MD  Patient Profile    David Crosby presents to the clinic today for follow-up evaluation of his palpitations and chest discomfort.  Past Medical History    Past Medical History:  Diagnosis Date   Asthma    Cardiomegaly    Seen on chest x-ray   GERD (gastroesophageal reflux disease)    Gout    Hyperlipidemia    Hypertension    Hypothyroidism    Motion sickness    Nephritis    Palpitation    Pneumonia    hx of pneumonia several times    PONV (postoperative nausea and vomiting)    Renal insufficiency    Stage II kidney disease per [facility] OV note of 04/02/2015    Past Surgical History:  Procedure Laterality Date   CYSTOSCOPY WITH RETROGRADE PYELOGRAM, URETEROSCOPY AND STENT PLACEMENT Left 05/23/2015   Procedure: CYSTOSCOPY, LEFT RETROGRADE PYELOGRAM, LEFT URETEROSCOPY,  BASKET EXTRACTION AND DOUBLE J STENT PLACEMENT LEFT URETERAL DILATION;  Surgeon: Crist Fat, MD;  Location: [facility];  Service: Urology;  Laterality: Left;   HOLMIUM LASER APPLICATION Left 05/23/2015   Procedure: HOLMIUM LASER LITHOTRIPSY ;  Surgeon: Crist Fat, MD;  Location: [facility];  Service: Urology;  Laterality: Left;   left foot surgery     Multiple   left forehead surgery      due to broken sinus cavity    LITHOTRIPSY Left 05/07/2015    Allergies  No Known Allergies  History of Present Illness    David Crosby has a PMH of shortness of breath, palpitations, chest discomfort, HLD, GERD and HTN.  Cardiac event monitor 4/17 showed normal sinus rhythm and 1 episode of bradycardia and no significant arrhythmias.  He was hospitalized 05/20/2012.  At that time he was evaluated by Dr. Kirke Corin.  During his hospitalization telemetry showed no arrhythmia, normal echocardiogram.  He was seen by Dr. Dina Rich in 2015 and 2016 for evaluation of palpitations.  He underwent nuclear stress testing 5/15 which showed variable soft tissue attenuation and very small region of ischemia in the mid-basal inferior wall.  He was seen in follow-up by Dr. Wyline Mood 5/17 with complaints of increased palpitations.  Cardiac event monitor was unremarkable at that time.  He followed up with Dr. Antoine Poche 8/23.  He noted increased palpitations.  Labs were checked and he was noted to have an elevated TSH with normal free T4.  Other lab work was stable.  It was recommended that he get a personal cardiac monitor to evaluate palpitations.  He was seen and evaluated by Carlos Levering NP-C NP-C on 04/08/2022.  He was also requesting preoperative cardiac evaluation for upcoming surgery at that time.  He reported 1 month of dyspnea with light exertion that had been getting worse.  He denies shortness of breath at rest.  He was using over-the-counter Alka-Seltzer and reported being out of his Breo inhaler for approximately 2 weeks.  He had not been participating in his normal walking program for the prior month.  He denied chest pain.  He did report some numbness in his first 3 fingers on his left hand which extend to his left forearm.  His upcoming surgery was for a torn ligament in his right shoulder.  An echocardiogram was ordered and showed normal ejection fraction with no significant  valvular abnormalities.  Nuclear stress test was also ordered which showed no ischemia and low risk.  His lab work was stable.  He presents to the clinic today for follow-up evaluation and states he had a pulmonary test and was diagnosed with mild COPD.  He has been using her inhaler and is working with his primary care physician.  We reviewed his echocardiogram and stress testing.  He expressed understanding.  We reviewed his upcoming right shoulder surgery.  His blood pressure is well-controlled at 132/84.  I encouraged him to continue using his  incentive spirometer and increase his physical activity.  I will send this note to  orthopedics and plan follow-up in 12 months.  Today he denies chest pain, increased shortness of breath, lower extremity edema, fatigue, palpitations, melena, hematuria, hemoptysis, diaphoresis, weakness, presyncope, syncope, orthopnea, and PND.   Home Medications    Prior to Admission medications   Medication Sig Start Date End Date Taking? Authorizing Provider  acebutolol (SECTRAL) 200 MG capsule TAKE 2 CAPSULES BY MOUTH IN THE MORNING AND 1 IN THE EVENING MUST  HAVE  OFFICE  VISIT  FOR  ADDITIONAL  REFILLS 02/05/17   Antoine PocheBranch, Jonathan F, MD  albuterol (PROVENTIL HFA;VENTOLIN HFA) 108 (90 Base) MCG/ACT inhaler Inhale 1 puff into the lungs every 6 (six) hours as needed for wheezing or shortness of breath.    [provider]  amLODipine (NORVASC) 5 MG tablet Take 1.5 tablets (7.5 mg total) by mouth daily. 04/08/22   Carlos LeveringWittenborn, Deborah, NP  aspirin 81 MG tablet Take 81 mg by mouth daily. Reported on 06/11/2015    [provider]  febuxostat (ULORIC) 40 MG tablet Take 80 mg by mouth daily.     [provider]  fluticasone furoate-vilanterol (BREO ELLIPTA) 200-25 MCG/INH AEPB Inhale 1 puff into the lungs as needed.     [provider]  levothyroxine (SYNTHROID, LEVOTHROID) 100 MCG tablet Take 100 mcg by mouth daily before breakfast.    [provider]  lisinopril (PRINIVIL,ZESTRIL) 20 MG tablet Take 1 tablet by mouth daily.  04/13/14   [provider]  phenazopyridine (PYRIDIUM) 200 MG tablet Take 1 tablet (200 mg total) by mouth 3 (three) times daily as needed for pain. 05/23/15   Crist FatHerrick, Benjamin W, MD  rosuvastatin (CRESTOR) 10 MG tablet Take 1 tablet (10 mg total) by mouth daily. 04/08/22 08/06/22  Carlos LeveringWittenborn, Deborah, NP  tamsulosin (FLOMAX) 0.4 MG CAPS capsule Take 1 capsule (0.4 mg total) by mouth daily after supper. Patient taking differently: Take 0.4 mg by  mouth daily after supper. 05/07/15   Ihor Gullyttelin, Mark, MD  traMADol (ULTRAM) 50 MG tablet Take 1-2 tablets (50-100 mg total) by mouth every 6 (six) hours as needed for moderate pain. 05/23/15   Crist FatHerrick, Benjamin W, MD  Trospium Chloride 60 MG CP24 Take 1 capsule (60 mg total) by mouth daily. 05/23/15   Crist FatHerrick, Benjamin W, MD    Family History    Family History  Problem Relation Age of Onset   Dementia Mother    Heart disease Father [id number]   Heart attack Other        Paternal and Maternal grandmother   Hypertension Other        Several family member   He indicated that his mother is alive. He indicated that his father is alive.  Social History    Social History   Socioeconomic History   Marital status: Legally Separated    Spouse name: Not on file  Number of children: Not on file   Years of education: Not on file   Highest education level: Not on file  Occupational History   Not on file  Tobacco Use   Smoking status: Never   Smokeless tobacco: Never  Substance and Sexual Activity   Alcohol use: No    Alcohol/week: 0.0 standard drinks of alcohol   Drug use: No   Sexual activity: Never  Other Topics Concern   Not on file  Social History Narrative   Lives at home with son.     Social Determinants of Health   Financial Resource Strain: Not on file  Food Insecurity: Not on file  Transportation Needs: Not on file  Physical Activity: Not on file  Stress: Not on file  Social Connections: Not on file  Intimate Partner Violence: Not on file     Review of Systems    General:  No chills, fever, night sweats or weight changes.  Cardiovascular:  No chest pain, dyspnea on exertion, edema, orthopnea, palpitations, paroxysmal nocturnal dyspnea. Dermatological: No rash, lesions/masses Respiratory: No cough, dyspnea Urologic: No hematuria, dysuria Abdominal:   No nausea, vomiting, diarrhea, bright red blood per rectum, melena, or hematemesis Neurologic:  No visual changes, wkns,  changes in mental status. All other systems reviewed and are otherwise negative except as noted above.  Physical Exam    VS:  BP 132/84   Pulse 67   Ht 5\' 10"  (1.778 m)   Wt 288 lb 12.8 oz (131 kg)   SpO2 94%   BMI 41.44 kg/m  , BMI Body mass index is 41.44 kg/m. GEN: Well nourished, well developed, in no acute distress. HEENT: normal. Neck: Supple, no JVD, carotid bruits, or masses. Cardiac: RRR, no murmurs, rubs, or gallops. No clubbing, cyanosis, edema.  Radials/DP/PT 2+ and equal bilaterally.  Respiratory:  Respirations regular and unlabored, clear to auscultation bilaterally. GI: Soft, nontender, nondistended, BS + x 4. MS: no deformity or atrophy. Skin: warm and dry, no rash. Neuro:  Strength and sensation are intact. Psych: Normal affect.  Accessory Clinical Findings    Recent Labs: 10/31/2021: ALT 27; Magnesium 2.0; TSH 6.390 04/08/2022: BUN 17; Creatinine, Ser 1.18; Hemoglobin 16.0; Platelets 263; Potassium 4.0; Sodium 141   Recent Lipid Panel    Component Value Date/Time   CHOL 164 10/31/2021 0906   TRIG 167 (H) 10/31/2021 0906   HDL 32 (L) 10/31/2021 0906   CHOLHDL 5.1 (H) 10/31/2021 0906   CHOLHDL 5.6 07/06/2013 0913   VLDL 41 (H) 07/06/2013 0913   LDLCALC 103 (H) 10/31/2021 0906         ECG personally reviewed by me today-none today.- No acute changes  Echocardiogram 05/29/2022 IMPRESSIONS     1. Left ventricular ejection fraction, by estimation, is 60 to 65%. The  left ventricle has normal function. The left ventricle has no regional  wall motion abnormalities. Left ventricular diastolic parameters were  normal.   2. Right ventricular systolic function is normal. The right ventricular  size is normal.   3. The mitral valve is normal in structure. Trivial mitral valve  regurgitation. No evidence of mitral stenosis.   4. The aortic valve is tricuspid. Aortic valve regurgitation is trivial.  No aortic stenosis is present.   5. Aortic dilatation  noted. There is mild dilatation of the aortic root,  measuring 39 mm.   6. The inferior vena cava is normal in size with greater than 50%  respiratory variability, suggesting right atrial pressure of 3  mmHg.   Comparison(s): No prior Echocardiogram.   Nuclear stress test 04/17/2022    Findings are consistent with no ischemia. The study is low risk.   No ST deviation was noted.   LV perfusion is abnormal. There is no evidence of ischemia. There is no evidence of infarction. Defect 1: There is a small defect with mild reduction in uptake present in the apical apex location(s) that is fixed. There is normal wall motion in the defect area. Consistent with artifact.   Left ventricular function is abnormal. Global function is mildly reduced. Nuclear stress EF: 51 %. The left ventricular ejection fraction is mildly decreased (45-54%). End diastolic cavity size is mildly enlarged. End systolic cavity size is mildly enlarged.   Prior study available for comparison from 08/04/2013. No changes compared to prior study. No significant ischemia. Possible small area of ischemia in the mid-basal inferolateral wall. LVEF 58%   Small size, mild intensity fixed (SDS 0) apical perfusion defect, suspect thinning. Mildly dilated LV with low normal LVEF 51%. This is a low risk study. Compared to a prior study in 2015, the LVEF is lower and the LV appears dilated.  Assessment & Plan   1.  Dyspnea on exertion-somewhat improved.  Now working with his PCP and had pulmonary function test.  Using inhaler for mild COPD.  Echocardiogram and nuclear stress test reassuring.  Details above.  Lab work unremarkable.  DOE does not appear to be related to cardiac issues. Increase physical activity as tolerated Follow-up with PCP  Essential hypertension-BP today 132/84.  Tolerating increased amlodipine well. Continue current medical therapy Heart healthy low-sodium diet Increase physical activity as tolerated  Palpitations-denies  recent episodes of accelerated or irregular heart rate.  Previous cardiac event monitor showed no significant arrhythmia. Continue acebutolol Avoid triggers  Hyperlipidemia-LDL 103 8/23. Continue rosuvastatin Heart healthy low-sodium high-fiber diet Follows with pcp  Preoperative cardiac evaluation-right shoulder surgery  Primary Cardiologist: Rollene Rotunda, MD  Chart reviewed as part of pre-operative protocol coverage. Given past medical history and time since last visit, based on ACC/AHA guidelines, DELAND SLOCUMB would be at acceptable risk for the planned procedure without further cardiovascular testing.   Patient was advised that if he develops new symptoms prior to surgery to contact our office to arrange a follow-up appointment.  He verbalized understanding.  His RCRI is a 0.4% risk of major cardiac event.  He is able to complete greater than 4 METS of physical activity.  He had recent echocardiogram and nuclear stress test which were reassuring.  He may proceed with upcoming surgery without further testing.  Disposition: Follow-up with Dr. Antoine Poche or me in 12 months.   Thomasene Ripple. Tarrah Furuta NP-C     06/20/2022, 10:51 AM [facility] [address] Office [phone] Fax [phone]    I spent 14 minutes examining this patient, reviewing medications, and using patient centered shared decision making involving her cardiac care.  Prior to her visit I spent greater than 20 minutes reviewing her past medical history,  medications, and prior cardiac tests.

## 2022-06-20 ENCOUNTER — Ambulatory Visit: Payer: Medicare Other | Attending: General Practice | Admitting: General Practice

## 2022-06-20 ENCOUNTER — Encounter: Payer: Self-pay | Admitting: General Practice

## 2022-06-20 VITALS — BP 132/84 | HR 67 | Ht 70.0 in | Wt 288.8 lb

## 2022-06-20 DIAGNOSIS — Z01810 Encounter for preprocedural cardiovascular examination: Secondary | ICD-10-CM | POA: Insufficient documentation

## 2022-06-20 DIAGNOSIS — I10 Essential (primary) hypertension: Secondary | ICD-10-CM | POA: Diagnosis not present

## 2022-06-20 DIAGNOSIS — R002 Palpitations: Secondary | ICD-10-CM | POA: Insufficient documentation

## 2022-06-20 DIAGNOSIS — E782 Mixed hyperlipidemia: Secondary | ICD-10-CM | POA: Diagnosis not present

## 2022-06-20 DIAGNOSIS — R0609 Other forms of dyspnea: Secondary | ICD-10-CM | POA: Insufficient documentation

## 2022-06-20 NOTE — Patient Instructions (Signed)
Medication Instructions:  The current medical regimen is effective;  continue present plan and medications as directed. Please refer to the Current Medication list given to you today. *If you need a refill on your cardiac medications before your next appointment, please call your pharmacy*  Lab Work: NONE If you have labs (blood work) drawn today and your tests are completely normal, you will receive your results only by: MyChart Message (if you have MyChart) OR A paper copy in the mail If you have any lab test that is abnormal or we need to change your treatment, we will call you to review the results.   Testing/Procedures: OK FOR SHOULDER SURGERY-WE WILL NOTIFY DR SUPPLE  Follow-Up: At [facility], you and your health needs are our priority.  As part of our continuing mission to provide you with exceptional heart care, we have created designated Provider Care Teams.  These Care Teams include your primary Cardiologist (physician) and Advanced Practice Providers (APPs -  Physician Assistants and Nurse Practitioners) who all work together to provide you with the care you need, when you need it.  We recommend signing up for the patient portal called "MyChart".  Sign up information is provided on this After Visit Summary.  MyChart is used to connect with patients for Virtual Visits (Telemedicine).  Patients are able to view lab/test results, encounter notes, upcoming appointments, etc.  Non-urgent messages can be sent to your provider as well.   To learn more about what you can do with MyChart, go to [email or web address].    Your next appointment:   12 month(s)  Provider:   Rollene Rotunda, MD  or Edd Fabian, FNP        Other Instructions

## 2022-06-30 ENCOUNTER — Encounter (HOSPITAL_COMMUNITY): Payer: Self-pay | Admitting: *Deleted

## 2022-06-30 ENCOUNTER — Other Ambulatory Visit: Payer: Self-pay

## 2022-06-30 ENCOUNTER — Emergency Department (HOSPITAL_COMMUNITY)
Admission: EM | Admit: 2022-06-30 | Discharge: 2022-06-30 | Discharge status: Against Medical Advice | Payer: Medicare Other | Attending: Emergency Medicine | Admitting: Emergency Medicine

## 2022-06-30 ENCOUNTER — Emergency Department (HOSPITAL_COMMUNITY): Payer: Medicare Other

## 2022-06-30 DIAGNOSIS — Z5321 Procedure and treatment not carried out due to patient leaving prior to being seen by health care provider: Secondary | ICD-10-CM | POA: Insufficient documentation

## 2022-06-30 DIAGNOSIS — R079 Chest pain, unspecified: Secondary | ICD-10-CM | POA: Insufficient documentation

## 2022-06-30 LAB — BASIC METABOLIC PANEL
Anion gap: 10 (ref 5–15)
BUN: 17 mg/dL (ref 6–20)
CO2: 26 mmol/L (ref 22–32)
Calcium: 9.1 mg/dL (ref 8.9–10.3)
Chloride: 100 mmol/L (ref 98–111)
Creatinine, Ser: 0.98 mg/dL (ref 0.61–1.24)
GFR, Estimated: >60 mL/min (ref >60)
Glucose, Bld: 138 mg/dL — ABNORMAL HIGH (ref 70–99)
Potassium: 4.1 mmol/L (ref 3.5–5.1)
Sodium: 136 mmol/L (ref 135–145)

## 2022-06-30 LAB — CBC
HCT: 48.6 % (ref 39.0–52.0)
Hemoglobin: 16.5 g/dL (ref 13.0–17.0)
MCH: 30.7 pg (ref 26.0–34.0)
MCHC: 34 g/dL (ref 30.0–36.0)
MCV: 90.3 fL (ref 80.0–100.0)
Platelets: 244 10*3/uL (ref 150–400)
RBC: 5.38 MIL/uL (ref 4.22–5.81)
RDW: 12.6 % (ref 11.5–15.5)
WBC: 7 10*3/uL (ref 4.0–10.5)
nRBC: 0 % (ref 0.0–0.2)

## 2022-06-30 LAB — TROPONIN I (HIGH SENSITIVITY): Troponin I (High Sensitivity): 19 ng/L — ABNORMAL HIGH (ref <18)

## 2022-06-30 NOTE — ED Triage Notes (Signed)
Pt c/o left side chest pain that started yesterday;  pt states the pain is intermittent and describes it as a dull pain  Pt states he has had diaphoresis with the pain but no other sx

## 2022-08-26 ENCOUNTER — Other Ambulatory Visit: Payer: Self-pay

## 2022-08-26 MED ORDER — ROSUVASTATIN CALCIUM 10 MG PO TABS: 10.0000 mg | ORAL_TABLET | Freq: Every day | ORAL | 3 refills | Status: DC

## 2023-02-12 ENCOUNTER — Emergency Department (HOSPITAL_COMMUNITY): Payer: Medicare Other

## 2023-02-12 ENCOUNTER — Other Ambulatory Visit: Payer: Self-pay

## 2023-02-12 ENCOUNTER — Encounter (HOSPITAL_COMMUNITY): Payer: Self-pay | Admitting: Emergency Medicine

## 2023-02-12 ENCOUNTER — Inpatient Hospital Stay (HOSPITAL_COMMUNITY)
Admission: EM | Admit: 2023-02-12 | Discharge: 2023-02-14 | DRG: 190 | Disposition: A | Discharge status: Home / Self Care | Payer: Medicare Other | Attending: Internal Medicine | Admitting: Internal Medicine

## 2023-02-12 DIAGNOSIS — M109 Gout, unspecified: Secondary | ICD-10-CM | POA: Diagnosis present

## 2023-02-12 DIAGNOSIS — E039 Hypothyroidism, unspecified: Secondary | ICD-10-CM | POA: Diagnosis present

## 2023-02-12 DIAGNOSIS — Z8249 Family history of ischemic heart disease and other diseases of the circulatory system: Secondary | ICD-10-CM

## 2023-02-12 DIAGNOSIS — I10 Essential (primary) hypertension: Secondary | ICD-10-CM | POA: Diagnosis present

## 2023-02-12 DIAGNOSIS — Z79899 Other long term (current) drug therapy: Secondary | ICD-10-CM

## 2023-02-12 DIAGNOSIS — J441 Chronic obstructive pulmonary disease with (acute) exacerbation: Principal | ICD-10-CM | POA: Diagnosis present

## 2023-02-12 DIAGNOSIS — J9601 Acute respiratory failure with hypoxia: Secondary | ICD-10-CM | POA: Diagnosis present

## 2023-02-12 DIAGNOSIS — N400 Enlarged prostate without lower urinary tract symptoms: Secondary | ICD-10-CM | POA: Diagnosis present

## 2023-02-12 DIAGNOSIS — J440 Chronic obstructive pulmonary disease with acute lower respiratory infection: Principal | ICD-10-CM | POA: Diagnosis present

## 2023-02-12 DIAGNOSIS — Z7982 Long term (current) use of aspirin: Secondary | ICD-10-CM

## 2023-02-12 DIAGNOSIS — Z1152 Encounter for screening for COVID-19: Secondary | ICD-10-CM

## 2023-02-12 DIAGNOSIS — Z794 Long term (current) use of insulin: Secondary | ICD-10-CM

## 2023-02-12 DIAGNOSIS — E66813 Obesity, class 3: Secondary | ICD-10-CM | POA: Diagnosis present

## 2023-02-12 DIAGNOSIS — Z6841 Body Mass Index (BMI) 40.0 and over, adult: Secondary | ICD-10-CM

## 2023-02-12 DIAGNOSIS — J471 Bronchiectasis with (acute) exacerbation: Secondary | ICD-10-CM | POA: Diagnosis present

## 2023-02-12 DIAGNOSIS — K219 Gastro-esophageal reflux disease without esophagitis: Secondary | ICD-10-CM | POA: Diagnosis present

## 2023-02-12 DIAGNOSIS — E785 Hyperlipidemia, unspecified: Secondary | ICD-10-CM | POA: Diagnosis present

## 2023-02-12 DIAGNOSIS — E119 Type 2 diabetes mellitus without complications: Secondary | ICD-10-CM

## 2023-02-12 DIAGNOSIS — Z79890 Hormone replacement therapy (postmenopausal): Secondary | ICD-10-CM

## 2023-02-12 LAB — BLOOD GAS, VENOUS
Acid-Base Excess: 6.4 mmol/L — ABNORMAL HIGH (ref 0.0–2.0)
Bicarbonate: 33.5 mmol/L — ABNORMAL HIGH (ref 20.0–28.0)
Drawn by: 6509
O2 Saturation: 60.2 %
Patient temperature: 36.8
pCO2, Ven: 57 mm[Hg] (ref 44–60)
pH, Ven: 7.37 (ref 7.25–7.43)
pO2, Ven: 33 mm[Hg] (ref 32–45)

## 2023-02-12 LAB — COMPREHENSIVE METABOLIC PANEL
ALT: 54 U/L — ABNORMAL HIGH (ref 0–44)
AST: 44 U/L — ABNORMAL HIGH (ref 15–41)
Albumin: 4.2 g/dL (ref 3.5–5.0)
Alkaline Phosphatase: 56 U/L (ref 38–126)
Anion gap: 9 (ref 5–15)
BUN: 18 mg/dL (ref 6–20)
CO2: 26 mmol/L (ref 22–32)
Calcium: 9.1 mg/dL (ref 8.9–10.3)
Chloride: 101 mmol/L (ref 98–111)
Creatinine, Ser: 1.03 mg/dL (ref 0.61–1.24)
GFR, Estimated: >60 mL/min (ref >60)
Glucose, Bld: 150 mg/dL — ABNORMAL HIGH (ref 70–99)
Potassium: 4.9 mmol/L (ref 3.5–5.1)
Sodium: 136 mmol/L (ref 135–145)
Total Bilirubin: 0.7 mg/dL (ref <1.2)
Total Protein: 8.1 g/dL (ref 6.5–8.1)

## 2023-02-12 LAB — RESP PANEL BY RT-PCR (RSV, FLU A&B, COVID)  RVPGX2
Influenza A by PCR: NEGATIVE
Influenza B by PCR: NEGATIVE
Resp Syncytial Virus by PCR: NEGATIVE
SARS Coronavirus 2 by RT PCR: NEGATIVE

## 2023-02-12 LAB — CBC WITH DIFFERENTIAL/PLATELET
Abs Immature Granulocytes: 0.04 10*3/uL (ref 0.00–0.07)
Basophils Absolute: 0.1 10*3/uL (ref 0.0–0.1)
Basophils Relative: 1 %
Eosinophils Absolute: 0.3 10*3/uL (ref 0.0–0.5)
Eosinophils Relative: 3 %
HCT: 47.9 % (ref 39.0–52.0)
Hemoglobin: 16.2 g/dL (ref 13.0–17.0)
Immature Granulocytes: 0 %
Lymphocytes Relative: 8 %
Lymphs Abs: 0.9 10*3/uL (ref 0.7–4.0)
MCH: 30.9 pg (ref 26.0–34.0)
MCHC: 33.8 g/dL (ref 30.0–36.0)
MCV: 91.4 fL (ref 80.0–100.0)
Monocytes Absolute: 0.8 10*3/uL (ref 0.1–1.0)
Monocytes Relative: 8 %
Neutro Abs: 8.6 10*3/uL — ABNORMAL HIGH (ref 1.7–7.7)
Neutrophils Relative %: 80 %
Platelets: 251 10*3/uL (ref 150–400)
RBC: 5.24 MIL/uL (ref 4.22–5.81)
RDW: 12.4 % (ref 11.5–15.5)
WBC: 10.7 10*3/uL — ABNORMAL HIGH (ref 4.0–10.5)
nRBC: 0 % (ref 0.0–0.2)

## 2023-02-12 LAB — MAGNESIUM: Magnesium: 1.8 mg/dL (ref 1.7–2.4)

## 2023-02-12 LAB — HEMOGLOBIN A1C
Hgb A1c MFr Bld: 6.5 % — ABNORMAL HIGH (ref 4.8–5.6)
Mean Plasma Glucose: 139.85 mg/dL

## 2023-02-12 LAB — HIV ANTIBODY (ROUTINE TESTING W REFLEX): HIV Screen 4th Generation wRfx: NONREACTIVE

## 2023-02-12 LAB — TSH: TSH: 2.922 u[IU]/mL (ref 0.350–4.500)

## 2023-02-12 LAB — TROPONIN I (HIGH SENSITIVITY): Troponin I (High Sensitivity): 6 ng/L (ref <18)

## 2023-02-12 LAB — BRAIN NATRIURETIC PEPTIDE: B Natriuretic Peptide: 35 pg/mL (ref 0.0–100.0)

## 2023-02-12 MED ORDER — LOSARTAN POTASSIUM 50 MG PO TABS
25.0000 mg | ORAL_TABLET | Freq: Every day | ORAL | Status: DC
  Administered 2023-02-12 – 2023-02-13 (×2): 25 mg via ORAL
  Filled 2023-02-12 (×2): qty 1

## 2023-02-12 MED ORDER — FEBUXOSTAT 40 MG PO TABS
80.0000 mg | ORAL_TABLET | Freq: Every day | ORAL | Status: DC
Start: 2023-02-12 — End: 2023-02-14
  Administered 2023-02-12 – 2023-02-14 (×3): 80 mg via ORAL
  Filled 2023-02-12 (×3): qty 2

## 2023-02-12 MED ORDER — ACETAMINOPHEN 650 MG RE SUPP: 650.0000 mg | Freq: Four times a day (QID) | RECTAL | Status: DC | PRN

## 2023-02-12 MED ORDER — DM-GUAIFENESIN ER 30-600 MG PO TB12
1.0000 | ORAL_TABLET | Freq: Two times a day (BID) | ORAL | Status: DC
  Administered 2023-02-12 – 2023-02-14 (×5): 1 via ORAL
  Filled 2023-02-12 (×5): qty 1

## 2023-02-12 MED ORDER — ENOXAPARIN SODIUM 40 MG/0.4ML IJ SOSY
40.0000 mg | PREFILLED_SYRINGE | Freq: Every day | INTRAMUSCULAR | Status: DC
Start: 2023-02-12 — End: 2023-02-14
  Administered 2023-02-12 – 2023-02-14 (×3): 40 mg via SUBCUTANEOUS
  Filled 2023-02-12 (×3): qty 0.4

## 2023-02-12 MED ORDER — ROSUVASTATIN CALCIUM 10 MG PO TABS
10.0000 mg | ORAL_TABLET | Freq: Every day | ORAL | Status: DC
  Administered 2023-02-12 – 2023-02-14 (×3): 10 mg via ORAL
  Filled 2023-02-12 (×3): qty 1

## 2023-02-12 MED ORDER — SODIUM CHLORIDE 0.9 % IV SOLN: 250.0000 mL | INTRAVENOUS | Status: AC | PRN

## 2023-02-12 MED ORDER — ASPIRIN 81 MG PO TBEC
81.0000 mg | DELAYED_RELEASE_TABLET | Freq: Every day | ORAL | Status: DC
  Administered 2023-02-12 – 2023-02-14 (×3): 81 mg via ORAL
  Filled 2023-02-12 (×3): qty 1

## 2023-02-12 MED ORDER — SODIUM CHLORIDE 0.9% FLUSH: 3.0000 mL | INTRAVENOUS | Status: DC | PRN

## 2023-02-12 MED ORDER — METHYLPREDNISOLONE SODIUM SUCC 125 MG IJ SOLR
125.0000 mg | Freq: Once | INTRAMUSCULAR | Status: AC
  Administered 2023-02-12: 125 mg via INTRAVENOUS
  Filled 2023-02-12: qty 2

## 2023-02-12 MED ORDER — LEVOTHYROXINE SODIUM 100 MCG PO TABS
100.0000 ug | ORAL_TABLET | Freq: Every day | ORAL | Status: DC
  Administered 2023-02-12 – 2023-02-14 (×3): 100 ug via ORAL
  Filled 2023-02-12 (×2): qty 1
  Filled 2023-02-12: qty 2

## 2023-02-12 MED ORDER — ALBUTEROL SULFATE HFA 108 (90 BASE) MCG/ACT IN AERS: 2.0000 | INHALATION_SPRAY | RESPIRATORY_TRACT | Status: DC | PRN

## 2023-02-12 MED ORDER — ONDANSETRON HCL 4 MG/2ML IJ SOLN: 4.0000 mg | Freq: Four times a day (QID) | INTRAMUSCULAR | Status: DC | PRN

## 2023-02-12 MED ORDER — TAMSULOSIN HCL 0.4 MG PO CAPS
0.4000 mg | ORAL_CAPSULE | ORAL | Status: DC
  Administered 2023-02-12 – 2023-02-13 (×2): 0.4 mg via ORAL
  Filled 2023-02-12 (×3): qty 1

## 2023-02-12 MED ORDER — ALBUTEROL SULFATE (2.5 MG/3ML) 0.083% IN NEBU
INHALATION_SOLUTION | RESPIRATORY_TRACT | Status: AC
  Filled 2023-02-12: qty 12

## 2023-02-12 MED ORDER — BUDESONIDE 0.5 MG/2ML IN SUSP
0.5000 mg | Freq: Two times a day (BID) | RESPIRATORY_TRACT | Status: DC
  Administered 2023-02-12 – 2023-02-14 (×5): 0.5 mg via RESPIRATORY_TRACT
  Filled 2023-02-12 (×5): qty 2

## 2023-02-12 MED ORDER — IPRATROPIUM-ALBUTEROL 0.5-2.5 (3) MG/3ML IN SOLN
3.0000 mL | Freq: Once | RESPIRATORY_TRACT | Status: AC
  Administered 2023-02-12: 3 mL via RESPIRATORY_TRACT
  Filled 2023-02-12: qty 3

## 2023-02-12 MED ORDER — DOXYCYCLINE HYCLATE 100 MG PO TABS
100.0000 mg | ORAL_TABLET | Freq: Two times a day (BID) | ORAL | Status: DC
  Administered 2023-02-12 – 2023-02-14 (×4): 100 mg via ORAL
  Filled 2023-02-12 (×5): qty 1

## 2023-02-12 MED ORDER — AMLODIPINE BESYLATE 5 MG PO TABS
7.5000 mg | ORAL_TABLET | Freq: Every day | ORAL | Status: DC
  Administered 2023-02-12 – 2023-02-14 (×3): 7.5 mg via ORAL
  Filled 2023-02-12 (×3): qty 2

## 2023-02-12 MED ORDER — ONDANSETRON HCL 4 MG/2ML IJ SOLN
4.0000 mg | Freq: Once | INTRAMUSCULAR | Status: AC
  Administered 2023-02-12: 4 mg via INTRAVENOUS
  Filled 2023-02-12: qty 2

## 2023-02-12 MED ORDER — ALBUTEROL (5 MG/ML) CONTINUOUS INHALATION SOLN
10.0000 mg/h | INHALATION_SOLUTION | RESPIRATORY_TRACT | Status: DC
  Administered 2023-02-12: 10 mg/h via RESPIRATORY_TRACT
  Filled 2023-02-12: qty 20

## 2023-02-12 MED ORDER — SODIUM CHLORIDE 0.9% FLUSH
3.0000 mL | Freq: Two times a day (BID) | INTRAVENOUS | Status: DC
  Administered 2023-02-12 – 2023-02-14 (×5): 3 mL via INTRAVENOUS

## 2023-02-12 MED ORDER — PANTOPRAZOLE SODIUM 40 MG PO TBEC
40.0000 mg | DELAYED_RELEASE_TABLET | Freq: Two times a day (BID) | ORAL | Status: DC
  Administered 2023-02-12 – 2023-02-14 (×5): 40 mg via ORAL
  Filled 2023-02-12 (×5): qty 1

## 2023-02-12 MED ORDER — METHYLPREDNISOLONE SODIUM SUCC 40 MG IJ SOLR
40.0000 mg | Freq: Two times a day (BID) | INTRAMUSCULAR | Status: DC
  Administered 2023-02-12 – 2023-02-14 (×4): 40 mg via INTRAVENOUS
  Filled 2023-02-12 (×4): qty 1

## 2023-02-12 MED ORDER — IPRATROPIUM-ALBUTEROL 0.5-2.5 (3) MG/3ML IN SOLN: 3.0000 mL | Freq: Once | RESPIRATORY_TRACT | Status: DC

## 2023-02-12 MED ORDER — ACEBUTOLOL HCL 200 MG PO CAPS
200.0000 mg | ORAL_CAPSULE | Freq: Every day | ORAL | Status: DC
  Administered 2023-02-12 – 2023-02-14 (×3): 200 mg via ORAL
  Filled 2023-02-12 (×5): qty 1

## 2023-02-12 MED ORDER — ONDANSETRON HCL 4 MG PO TABS: 4.0000 mg | ORAL_TABLET | Freq: Four times a day (QID) | ORAL | Status: DC | PRN

## 2023-02-12 MED ORDER — ARFORMOTEROL TARTRATE 15 MCG/2ML IN NEBU
15.0000 ug | INHALATION_SOLUTION | Freq: Two times a day (BID) | RESPIRATORY_TRACT | Status: DC
  Administered 2023-02-12 – 2023-02-14 (×5): 15 ug via RESPIRATORY_TRACT
  Filled 2023-02-12 (×5): qty 2

## 2023-02-12 MED ORDER — IPRATROPIUM-ALBUTEROL 0.5-2.5 (3) MG/3ML IN SOLN
3.0000 mL | Freq: Four times a day (QID) | RESPIRATORY_TRACT | Status: DC
  Administered 2023-02-12 – 2023-02-13 (×6): 3 mL via RESPIRATORY_TRACT
  Filled 2023-02-12 (×6): qty 3

## 2023-02-12 MED ORDER — ACETAMINOPHEN 325 MG PO TABS
650.0000 mg | ORAL_TABLET | Freq: Four times a day (QID) | ORAL | Status: DC | PRN
  Administered 2023-02-13: 650 mg via ORAL
  Filled 2023-02-12: qty 2

## 2023-02-12 NOTE — Assessment & Plan Note (Signed)
-  Patient with acute hypoxic respiratory failure due to COPD exacerbation and bronchiectasis -Started on steroids, bronchodilator management, mucolytic's, oral doxycycline, flutter valve and oxygen supplementation. -Wean off oxygen as tolerated; prior to admission no using oxygen supplementation. -Continue supportive care and follow clinical response.

## 2023-02-12 NOTE — Assessment & Plan Note (Signed)
Continue statin. 

## 2023-02-12 NOTE — Assessment & Plan Note (Signed)
-  No complaints of urinary retention currently -Continue treatment with Flomax.

## 2023-02-12 NOTE — ED Triage Notes (Addendum)
Pt c/o shob/dry cough since last night. Worse today. Denies pain. No ble swelling noted. Abd distended/slightly firm which pt states is not usually that tight. Denies hx of CHF, COPD. Mild labored breathing with dry cough noted in triage.  Stats shob much worse with activity

## 2023-02-12 NOTE — Assessment & Plan Note (Signed)
-  Continue home antihypertensive agents -Follow vital signs. -Heart healthy diet discussed with patient.

## 2023-02-12 NOTE — Assessment & Plan Note (Signed)
-  Update TSH -Continue Synthroid. 

## 2023-02-12 NOTE — Assessment & Plan Note (Signed)
-  Recently diagnosed -Patient has been started on metformin as an outpatient -Holding oral hypoglycemic agents at the moment -Updated A1c -Sliding scale insulin and Semglee has been started. -Follow CBG fluctuation.

## 2023-02-12 NOTE — Assessment & Plan Note (Signed)
-  Body mass index is 41.14 kg/m. -Low-calorie diet, portion control and increase physical activity discussed with patient -Outpatient sleep study recommended.

## 2023-02-12 NOTE — ED Provider Notes (Signed)
[place] EMERGENCY DEPARTMENT AT [facility] Provider Note   CSN: [id number] Arrival date & time: 02/12/23  4098     History  Chief Complaint  Patient presents with   Shortness of Breath   Cough    David Crosby is a 57 y.o. male with a past medical history significant for hyperlipidemia, hypothyroidism, hypertension, COPD who presents to the ED due to cough and shortness of breath that started last night.  Patient has been having intermittent shortness of breath over the past few months however, worsened last night.  Patient states shortness of breath worse when lying flat and with exertion.  No fever.  Admits to a dry cough.  No sick contacts.  Denies associated chest pain.  Is followed by cardiology and had an echocardiogram on 05/29/2022 with a normal EF.  Also had a reassuring nuclear stress test on 04/17/2022 with no evidence of ischemia.  No history of CHF.  Admits to some nausea however no vomiting or diarrhea.  History obtained from patient and past medical records. No interpreter used during encounter.       Home Medications Prior to Admission medications   Medication Sig Start Date End Date Taking? Authorizing Provider  acebutolol (SECTRAL) 200 MG capsule TAKE 2 CAPSULES BY MOUTH IN THE MORNING AND 1 IN THE EVENING MUST  HAVE  OFFICE  VISIT  FOR  ADDITIONAL  REFILLS 02/05/17   Antoine Poche, MD  albuterol (PROVENTIL HFA;VENTOLIN HFA) 108 (90 Base) MCG/ACT inhaler Inhale 1 puff into the lungs every 6 (six) hours as needed for wheezing or shortness of breath.    [provider]  amLODipine (NORVASC) 5 MG tablet Take 1.5 tablets (7.5 mg total) by mouth daily. 04/08/22   Carlos Levering, NP  aspirin 81 MG tablet Take 81 mg by mouth daily. Reported on 06/11/2015    [provider]  febuxostat (ULORIC) 40 MG tablet Take 80 mg by mouth daily.     [provider]  fluticasone furoate-vilanterol (BREO ELLIPTA) 200-25 MCG/INH AEPB Inhale 1  puff into the lungs as needed.     [provider]  levothyroxine (SYNTHROID, LEVOTHROID) 100 MCG tablet Take 100 mcg by mouth daily before breakfast.    [provider]  lisinopril (PRINIVIL,ZESTRIL) 20 MG tablet Take 1 tablet by mouth daily.  04/13/14   [provider]  phenazopyridine (PYRIDIUM) 200 MG tablet Take 1 tablet (200 mg total) by mouth 3 (three) times daily as needed for pain. 05/23/15   Crist Fat, MD  rosuvastatin (CRESTOR) 10 MG tablet Take 1 tablet (10 mg total) by mouth daily. 08/26/22   Rollene Rotunda, MD  tamsulosin (FLOMAX) 0.4 MG CAPS capsule Take 1 capsule (0.4 mg total) by mouth daily after supper. Patient taking differently: Take 0.4 mg by mouth daily after supper. 05/07/15   Ihor Gully, MD  traMADol (ULTRAM) 50 MG tablet Take 1-2 tablets (50-100 mg total) by mouth every 6 (six) hours as needed for moderate pain. 05/23/15   Crist Fat, MD  Trospium Chloride 60 MG CP24 Take 1 capsule (60 mg total) by mouth daily. 05/23/15   Crist Fat, MD      Allergies    Patient has no known allergies.    Review of Systems   Review of Systems  Constitutional:  Negative for fever.  Respiratory:  Positive for cough and shortness of breath.   Cardiovascular:  Negative for chest pain and leg swelling.  Gastrointestinal:  Positive  for nausea. Negative for abdominal pain, diarrhea and vomiting.    Physical Exam Updated Vital Signs BP (!) 164/63   Pulse 90   Temp 98.2 F (36.8 C) (Oral)   Resp (!) 21   SpO2 96%  Physical Exam Vitals and nursing note reviewed.  Constitutional:      General: He is not in acute distress.    Appearance: He is not ill-appearing.  HENT:     Head: Normocephalic.  Eyes:     Pupils: Pupils are equal, round, and reactive to light.  Cardiovascular:     Rate and Rhythm: Normal rate and regular rhythm.     Pulses: Normal pulses.     Heart sounds: Normal heart sounds. No murmur heard.    No friction  rub. No gallop.  Pulmonary:     Effort: Pulmonary effort is normal.     Breath sounds: Wheezing present.  Abdominal:     General: Abdomen is flat. There is no distension.     Palpations: Abdomen is soft.     Tenderness: There is no abdominal tenderness. There is no guarding or rebound.  Musculoskeletal:        General: Normal range of motion.     Cervical back: Neck supple.  Skin:    General: Skin is warm and dry.  Neurological:     General: No focal deficit present.     Mental Status: He is alert.  Psychiatric:        Mood and Affect: Mood normal.        Behavior: Behavior normal.     ED Results / Procedures / Treatments   Labs (all labs ordered are listed, but only abnormal results are displayed) Labs Reviewed  CBC WITH DIFFERENTIAL/PLATELET - Abnormal; Notable for the following components:      Result Value   WBC 10.7 (*)    Neutro Abs 8.6 (*)    All other components within normal limits  COMPREHENSIVE METABOLIC PANEL - Abnormal; Notable for the following components:   Glucose, Bld 150 (*)    AST 44 (*)    ALT 54 (*)    All other components within normal limits  BLOOD GAS, VENOUS - Abnormal; Notable for the following components:   Bicarbonate 33.5 (*)    Acid-Base Excess 6.4 (*)    All other components within normal limits  RESP PANEL BY RT-PCR (RSV, FLU A&B, COVID)  RVPGX2  BRAIN NATRIURETIC PEPTIDE  TROPONIN I (HIGH SENSITIVITY)    EKG None  Radiology DG Chest 2 View  Result Date: 02/12/2023 CLINICAL DATA:  Shortness of breath EXAM: CHEST - 2 VIEW COMPARISON:  X-ray 06/30/2022. FINDINGS: Film is under penetrated. No consolidation, pneumothorax or effusion. No edema. Normal cardiopericardial silhouette when adjusting for technique. Overlapping cardiac leads. The inferior costophrenic angles are clipped off the edge of the film on lateral view. Endplate osteophytes identified along the spine. IMPRESSION: Limited lateral view. Grossly no acute cardiopulmonary  disease. Under penetrated radiograph Electronically Signed   By: Karen Kays M.D.   On: 02/12/2023 10:45    Procedures .Critical Care  Performed by: Mannie Stabile, PA-C Authorized by: Mannie Stabile, PA-C   Critical care provider statement:    Critical care time (minutes):  33   Critical care was necessary to treat or prevent imminent or life-threatening deterioration of the following conditions:  Respiratory failure   Critical care was time spent personally by me on the following activities:  Development of treatment plan with  patient or surrogate, discussions with consultants, evaluation of patient's response to treatment, examination of patient, ordering and review of laboratory studies, ordering and review of radiographic studies, ordering and performing treatments and interventions, pulse oximetry, re-evaluation of patient's condition and review of old charts   I assumed direction of critical care for this patient from another provider in my specialty: no     Care discussed with: admitting provider       Medications Ordered in ED Medications  albuterol (PROVENTIL,VENTOLIN) solution continuous neb (10 mg/hr Nebulization New Bag/Given 02/12/23 1144)  albuterol (PROVENTIL) (2.5 MG/3ML) 0.083% nebulizer solution (  Not Given 02/12/23 1149)  ipratropium-albuterol (DUONEB) 0.5-2.5 (3) MG/3ML nebulizer solution 3 mL (3 mLs Nebulization Given 02/12/23 1028)  methylPREDNISolone sodium succinate (SOLU-MEDROL) 125 mg/2 mL injection 125 mg (125 mg Intravenous Given by Other 02/12/23 1010)  ondansetron (ZOFRAN) injection 4 mg (4 mg Intravenous Given by Other 02/12/23 1011)    ED Course/ Medical Decision Making/ A&P Clinical Course as of 02/12/23 1239  Thu Feb 12, 2023  1050 B Natriuretic Peptide: 35.0 [CA]  1051 Troponin I (High Sensitivity): 6 [CA]  1051 WBC(!): 10.7 [CA]  1051 AST(!): 44 [CA]  1051 ALT(!): 54 [CA]  1104 Reassessed patient at bedside.  O2 saturation at 88% with good  waveform.  Patient already on 3 L nasal cannula.  Increased to 4 L with O2 saturation in the low 90s.  Lungs with continued expiratory wheeze.  Another DuoNeb treatment added. [CA]    Clinical Course User Index [CA] Mannie Stabile, PA-C                                 Medical Decision Making Amount and/or Complexity of Data Reviewed External Data Reviewed: notes.    Details: Cardiology notes Labs: ordered. Decision-making details documented in ED Course. Radiology: ordered and independent interpretation performed. Decision-making details documented in ED Course. ECG/medicine tests: ordered and independent interpretation performed. Decision-making details documented in ED Course.  Risk Prescription drug management. Decision regarding hospitalization.   This patient presents to the ED for concern of SOB/cough, this involves an extensive number of treatment options, and is a complaint that carries with it a high risk of complications and morbidity.  The differential diagnosis includes COPD, CHF, PNA, viral process, etc  57 year old male presents to the ED due to shortness of breath and cough that started last night.  History of COPD.  No history of CHF.  Denies history of blood clots.  Followed by cardiology with reassuring echocardiogram and nuclear stress test this year. Upon arrival patient afebrile, not tachycardic or hypoxic.  Patient speaking in broken up sentences.  Wheeze heard throughout.  No lower extremity edema.  Routine labs ordered.  BNP to rule out CHF.  Chest x-ray to rule out evidence of pneumonia.  RVP to rule out infection.  DuoNeb treatment and Solu-Medrol given. Lower suspicion for PE given no evidence of DVT on exam. Possible COPD exacerbation.   COVID/influenza negative.  BNP normal.  Low suspicion for CHF.  Troponin normal.  EKG with normal sinus rhythm.  No signs of acute ischemia.  Low suspicion for ACS.  Chest x-ray personally reviewed and interpreted which is  negative for signs of pneumonia.  CBC with slight leukocytosis at 10.7.  Normal hemoglobin.  CMP with normal renal function.  Mild transaminitis with AST at 44 and ALT at 54.    Reassessed patient as  noted above. Given new oxygen requirement, patient will require admission for COPD exacerbation. Low suspicion for PE.   Co morbidities that complicate the patient evaluation  COPD, HTN Cardiac Monitoring: / EKG:  The patient was maintained on a cardiac monitor.  I personally viewed and interpreted the cardiac monitored which showed an underlying rhythm of: NSR  Test / Admission - Considered:  CTA chest; however suspicion for PE is lower given wheeze on exam.   12:35 PM Discussed with Dr. Gwenlyn Perking with [facility] who agrees to admit patient.        Final Clinical Impression(s) / ED Diagnoses Final diagnoses:  COPD exacerbation (HCC)  Acute respiratory failure with hypoxia [facility])    Rx / DC Orders ED Discharge Orders     None         Jesusita Oka 02/12/23 1239    Pricilla Loveless, MD 02/18/23 [phone]

## 2023-02-12 NOTE — H&P (Addendum)
History and Physical    Patient: David Crosby EXB:[id number] DOB: Nov 09, 1965 DOA: 02/12/2023 DOS: the patient was seen and examined on 02/12/2023 PCP: Rebekah Chesterfield, NP  Patient coming from: Home  Chief Complaint:  Chief Complaint  Patient presents with   Shortness of Breath   Cough   HPI: David Crosby is a 57 y.o. male with medical history significant of gastroesophageal reflux disease, hypothyroidism, hypertension, hyperlipidemia, history of COPD/asthma, class III obesity and palpitation; presented to the hospital secondary to shortness of breath and dry coughing spells.  Symptoms have been present for the last week or so but worsening in the last 24-48 hours prior to admission.  Patient reports no fever, no nausea, no vomiting, no sick contacts, no dysuria, no hematuria, no focal weaknesses.  Patient also expressed no chest pain or orthopnea; and reported that shortness of breath is worse with exertion.  Workup in the ED demonstrating diffuse expiratory wheezing with positive rhonchi.  Chest x-ray without acute infiltrates, vascular congestion or opacities; positive bronchitic changes appreciated.  Improvement in his symptoms after nebulizer treatment provided.  3-4 L nasal cannula supplementation in place to maintain saturation as patient was in the mid 80s on room air.  [facility] consulted to place patient in the hospital for further evaluation and management of COPD/bronchiectasis exacerbation.  Review of Systems: As mentioned in the history of present illness. All other systems reviewed and are negative. Past Medical History:  Diagnosis Date   Asthma    Cardiomegaly    Seen on chest x-ray   GERD (gastroesophageal reflux disease)    Gout    Hyperlipidemia    Hypertension    Hypothyroidism    Motion sickness    Nephritis    Palpitation    Pneumonia    hx of pneumonia several times    PONV (postoperative nausea and vomiting)    Renal insufficiency    Stage II  kidney disease per [facility] OV note of 04/02/2015    Past Surgical History:  Procedure Laterality Date   CYSTOSCOPY WITH RETROGRADE PYELOGRAM, URETEROSCOPY AND STENT PLACEMENT Left 05/23/2015   Procedure: CYSTOSCOPY, LEFT RETROGRADE PYELOGRAM, LEFT URETEROSCOPY,  BASKET EXTRACTION AND DOUBLE J STENT PLACEMENT LEFT URETERAL DILATION;  Surgeon: Crist Fat, MD;  Location: [facility];  Service: Urology;  Laterality: Left;   HOLMIUM LASER APPLICATION Left 05/23/2015   Procedure: HOLMIUM LASER LITHOTRIPSY ;  Surgeon: Crist Fat, MD;  Location: [facility];  Service: Urology;  Laterality: Left;   left foot surgery     Multiple   left forehead surgery      due to broken sinus cavity    LITHOTRIPSY Left 05/07/2015   Social History:  reports that he has never smoked. He has never used smokeless tobacco. He reports that he does not drink alcohol and does not use drugs.  No Known Allergies  Family History  Problem Relation Age of Onset   Dementia Mother    Heart disease Father 73   Heart attack Other        Paternal and Maternal grandmother   Hypertension Other        Several family member    Prior to Admission medications   Medication Sig Start Date End Date Taking? Authorizing Provider  acebutolol (SECTRAL) 200 MG capsule TAKE 2 CAPSULES BY MOUTH IN THE MORNING AND 1 IN THE EVENING MUST  HAVE  OFFICE  VISIT  FOR  ADDITIONAL  REFILLS 02/05/17  Yes Antoine Poche,  MD  albuterol (PROVENTIL HFA;VENTOLIN HFA) 108 (90 Base) MCG/ACT inhaler Inhale 1 puff into the lungs every 6 (six) hours as needed for wheezing or shortness of breath.   Yes [provider]  amLODipine (NORVASC) 5 MG tablet Take 1.5 tablets (7.5 mg total) by mouth daily. 04/08/22  Yes Wittenborn, Gavin Pound, NP  aspirin 81 MG tablet Take 81 mg by mouth daily. Reported on 06/11/2015   Yes [provider]  febuxostat (ULORIC) 40 MG tablet Take 80 mg by mouth daily.    Yes [provider]  fluticasone  furoate-vilanterol (BREO ELLIPTA) 200-25 MCG/INH AEPB Inhale 1 puff into the lungs as needed.    Yes [provider]  levothyroxine (SYNTHROID, LEVOTHROID) 100 MCG tablet Take 100 mcg by mouth daily before breakfast.   Yes [provider]  lisinopril (PRINIVIL,ZESTRIL) 20 MG tablet Take 1 tablet by mouth daily.  04/13/14  Yes [provider]  phenazopyridine (PYRIDIUM) 200 MG tablet Take 1 tablet (200 mg total) by mouth 3 (three) times daily as needed for pain. 05/23/15  Yes Crist Fat, MD  rosuvastatin (CRESTOR) 10 MG tablet Take 1 tablet (10 mg total) by mouth daily. 08/26/22  Yes Rollene Rotunda, MD  traMADol (ULTRAM) 50 MG tablet Take 1-2 tablets (50-100 mg total) by mouth every 6 (six) hours as needed for moderate pain. 05/23/15  Yes Crist Fat, MD  tamsulosin (FLOMAX) 0.4 MG CAPS capsule Take 1 capsule (0.4 mg total) by mouth daily after supper. Patient not taking: Reported on 02/12/2023 05/07/15   Ihor Gully, MD  Trospium Chloride 60 MG CP24 Take 1 capsule (60 mg total) by mouth daily. Patient not taking: Reported on 02/12/2023 05/23/15   Crist Fat, MD    Physical Exam: Vitals:   02/12/23 1331 02/12/23 1606 02/12/23 1700 02/12/23 1841  BP:  (!) 147/78 (!) 140/72 (!) 105/46  Pulse:  (!) 108 75 86  Resp:  (!) 27 (!) 21 (!) 24  Temp:    98.1 F (36.7 C)  TempSrc:    Oral  SpO2:  (!) 88% 91% 93%  Weight: 133.8 kg     Height: 5\' 11"  (1.803 m)      General exam: Alert, awake, oriented x 3; no chest pain, no nausea, no vomiting.  Demonstrating difficulty speaking in full sentences. Respiratory system: Positive tachypnea, rhonchi and expiratory wheezing appreciated on exam.  No using accessory muscles. Cardiovascular system: Mild sinus tachycardia, no rubs, no gallops; unable to assess JVD with body habitus. Gastrointestinal system: Abdomen is obese, nondistended, soft and nontender. No organomegaly or masses felt. Normal bowel sounds  heard. Central nervous system: No focal neurological deficits. Extremities: No cyanosis or clubbing. Skin: No rashes, no petechiae. Psychiatry: Judgement and insight appear normal. Mood & affect appropriate.   Data Reviewed: CBC: WBCs 10.7, hemoglobin 16.2 platelet count 251K Respiratory panel by PCR: Demonstrating RSV, influenza and COVID to be negative. Comprehensive metabolic panel: Sodium 136, potassium 4.9, chloride 101, bicarb 26, glucose 150, BUN 18, creatinine 1.03, normal LFTs; GFR >60 BNP: 35    Assessment and Plan: Obesity, Class III, BMI 40-49.9 (morbid obesity) (HCC) -Body mass index is 41.14 kg/m. -Low-calorie diet, portion control and increase physical activity discussed with patient -Outpatient sleep study recommended.  Type 2 diabetes mellitus without complication, without long-term current use of insulin (HCC) -Recently diagnosed -Updated A1c -Sliding scale insulin and Semglee has been started (especially while receiving steroids). -Follow CBG fluctuation.  Hypertension -Continue home antihypertensive agents -Follow vital  signs. -Heart healthy diet discussed with patient.  COPD exacerbation (HCC) -Patient with acute hypoxic respiratory failure due to COPD exacerbation and bronchiectasis -Started on steroids, bronchodilator management, mucolytic's, oral doxycycline, flutter valve and oxygen supplementation. -Wean off oxygen as tolerated; prior to admission no using oxygen supplementation. -Continue supportive care and follow clinical response.  GERD (gastroesophageal reflux disease) -Continue PPI; dose adjusted to twice a day for better symptomatic management.  Acute respiratory failure with hypoxia (HCC) -Patient's oxygen saturation in the mid to high 80s on room air -Prior to admission no using oxygen supplementation -Currently 3-4 L supplementation required to maintain saturation above 90%. -Will continue treatment for COPD and wean off oxygen  supplementation as tolerated.  Hyperlipidemia -Continue statin.  Hypothyroidism -Update TSH -Continue Synthroid.  BPH (benign prostatic hyperplasia) -No complaints of urinary retention currently -Continue treatment with Flomax.     Advance Care Planning:   Code Status: Full Code   Consults: None  Family Communication: No family at bedside.  Severity of Illness: The appropriate patient status for this patient is OBSERVATION. Observation status is judged to be reasonable and necessary in order to provide the required intensity of service to ensure the patient's safety. The patient's presenting symptoms, physical exam findings, and initial radiographic and laboratory data in the context of their medical condition is felt to place them at decreased risk for further clinical deterioration. Furthermore, it is anticipated that the patient will be medically stable for discharge from the hospital within 2 midnights of admission.   Author: Vassie Loll, MD 02/12/2023 6:46 PM  For on call review [email or web address].

## 2023-02-12 NOTE — Assessment & Plan Note (Signed)
-  Continue PPI; dose adjusted to twice a day for better symptomatic management.

## 2023-02-12 NOTE — Assessment & Plan Note (Signed)
-  Patient's oxygen saturation in the mid to high 80s on room air -Prior to admission no using oxygen supplementation -Currently 3-4 L supplementation required to maintain saturation above 90%. -Will continue treatment for COPD and wean off oxygen supplementation as tolerated.

## 2023-02-13 DIAGNOSIS — E785 Hyperlipidemia, unspecified: Secondary | ICD-10-CM | POA: Diagnosis not present

## 2023-02-13 DIAGNOSIS — K219 Gastro-esophageal reflux disease without esophagitis: Secondary | ICD-10-CM | POA: Diagnosis not present

## 2023-02-13 DIAGNOSIS — J441 Chronic obstructive pulmonary disease with (acute) exacerbation: Secondary | ICD-10-CM | POA: Diagnosis not present

## 2023-02-13 LAB — GLUCOSE, CAPILLARY
Glucose-Capillary: 201 mg/dL — ABNORMAL HIGH (ref 70–99)
Glucose-Capillary: 190 mg/dL — ABNORMAL HIGH (ref 70–99)
Glucose-Capillary: 259 mg/dL — ABNORMAL HIGH (ref 70–99)
Glucose-Capillary: 297 mg/dL — ABNORMAL HIGH (ref 70–99)

## 2023-02-13 MED ORDER — IPRATROPIUM-ALBUTEROL 0.5-2.5 (3) MG/3ML IN SOLN
3.0000 mL | Freq: Three times a day (TID) | RESPIRATORY_TRACT | Status: DC
  Administered 2023-02-14 (×2): 3 mL via RESPIRATORY_TRACT
  Filled 2023-02-13 (×2): qty 3

## 2023-02-13 MED ORDER — INSULIN ASPART 100 UNIT/ML IJ SOLN
0.0000 [IU] | Freq: Every day | INTRAMUSCULAR | Status: DC
  Administered 2023-02-13: 3 [IU] via SUBCUTANEOUS

## 2023-02-13 MED ORDER — INSULIN ASPART 100 UNIT/ML IJ SOLN
0.0000 [IU] | Freq: Three times a day (TID) | INTRAMUSCULAR | Status: DC
  Administered 2023-02-13: 5 [IU] via SUBCUTANEOUS
  Administered 2023-02-13: 3 [IU] via SUBCUTANEOUS
  Administered 2023-02-13: 2 [IU] via SUBCUTANEOUS
  Administered 2023-02-14 (×2): 3 [IU] via SUBCUTANEOUS

## 2023-02-13 MED ORDER — INSULIN GLARGINE-YFGN 100 UNIT/ML ~~LOC~~ SOLN
5.0000 [IU] | Freq: Every day | SUBCUTANEOUS | Status: DC
  Administered 2023-02-13: 5 [IU] via SUBCUTANEOUS
  Filled 2023-02-13 (×2): qty 0.05

## 2023-02-13 NOTE — Progress Notes (Signed)
   02/13/23 1140  TOC Brief Assessment  Insurance and Status Reviewed  Patient has primary care physician Yes  Home environment has been reviewed Yes  Prior level of function: Independent  Prior/Current Home Services No current home services  Social Determinants of Health Reivew SDOH reviewed no interventions necessary  Readmission risk has been reviewed Yes  Transition of care needs no transition of care needs at this time

## 2023-02-13 NOTE — Plan of Care (Signed)
  Problem: Education: Goal: Knowledge of General Education information will improve Description: Including pain rating scale, medication(s)/side effects and non-pharmacologic comfort measures Outcome: Progressing   Problem: Health Behavior/Discharge Planning: Goal: Ability to manage health-related needs will improve Outcome: Progressing   Problem: Clinical Measurements: Goal: Ability to maintain clinical measurements within normal limits will improve Outcome: Progressing Goal: Will remain free from infection Outcome: Progressing Goal: Diagnostic test results will improve Outcome: Progressing Goal: Respiratory complications will improve Outcome: Progressing Goal: Cardiovascular complication will be avoided Outcome: Progressing   Problem: Activity: Goal: Risk for activity intolerance will decrease Outcome: Progressing   Problem: Nutrition: Goal: Adequate nutrition will be maintained Outcome: Progressing   Problem: Coping: Goal: Level of anxiety will decrease Outcome: Progressing   Problem: Elimination: Goal: Will not experience complications related to bowel motility Outcome: Progressing Goal: Will not experience complications related to urinary retention Outcome: Progressing   Problem: Pain Management: Goal: General experience of comfort will improve Outcome: Progressing   Problem: Safety: Goal: Ability to remain free from injury will improve Outcome: Progressing   Problem: Skin Integrity: Goal: Risk for impaired skin integrity will decrease Outcome: Progressing   Problem: Education: Goal: Knowledge of disease or condition will improve Outcome: Progressing Goal: Knowledge of the prescribed therapeutic regimen will improve Outcome: Progressing Goal: Individualized Educational Video(s) Outcome: Progressing   Problem: Activity: Goal: Ability to tolerate increased activity will improve Outcome: Progressing Goal: Will verbalize the importance of balancing  activity with adequate rest periods Outcome: Progressing   Problem: Respiratory: Goal: Ability to maintain a clear airway will improve Outcome: Progressing Goal: Levels of oxygenation will improve Outcome: Progressing Goal: Ability to maintain adequate ventilation will improve Outcome: Progressing   Problem: Education: Goal: Ability to describe self-care measures that may prevent or decrease complications (Diabetes Survival Skills Education) will improve Outcome: Progressing Goal: Individualized Educational Video(s) Outcome: Progressing   Problem: Coping: Goal: Ability to adjust to condition or change in health will improve Outcome: Progressing   Problem: Fluid Volume: Goal: Ability to maintain a balanced intake and output will improve Outcome: Progressing   Problem: Health Behavior/Discharge Planning: Goal: Ability to identify and utilize available resources and services will improve Outcome: Progressing Goal: Ability to manage health-related needs will improve Outcome: Progressing   Problem: Metabolic: Goal: Ability to maintain appropriate glucose levels will improve Outcome: Progressing   Problem: Nutritional: Goal: Maintenance of adequate nutrition will improve Outcome: Progressing Goal: Progress toward achieving an optimal weight will improve Outcome: Progressing   Problem: Skin Integrity: Goal: Risk for impaired skin integrity will decrease Outcome: Progressing   Problem: Tissue Perfusion: Goal: Adequacy of tissue perfusion will improve Outcome: Progressing

## 2023-02-13 NOTE — Progress Notes (Signed)
Progress Note   Patient: David Crosby WUJ:[id number] DOB: [birth year] DOA: 02/12/2023     0 DOS: the patient was seen and examined on 02/13/2023   Brief hospital admission course: David Crosby is a 57 y.o. male with medical history significant of gastroesophageal reflux disease, hypothyroidism, hypertension, hyperlipidemia, history of COPD/asthma, class III obesity and palpitation; presented to the hospital secondary to shortness of breath and dry coughing spells.  Symptoms have been present for the last week or so but worsening in the last 24-48 hours prior to admission.  Patient reports no fever, no nausea, no vomiting, no sick contacts, no dysuria, no hematuria, no focal weaknesses.   Patient also expressed no chest pain or orthopnea; and reported that shortness of breath is worse with exertion.   Workup in the ED demonstrating diffuse expiratory wheezing with positive rhonchi.  Chest x-ray without acute infiltrates, vascular congestion or opacities; positive bronchitic changes appreciated.   Improvement in his symptoms after nebulizer treatment provided.  3-4 L nasal cannula supplementation in place to maintain saturation as patient was in the mid 80s on room air.   TRH consulted to place patient in the hospital for further evaluation and management of COPD/bronchiectasis exacerbation.  Assessment and Plan: 1-acute respiratory failure with hypoxia -In the setting of COPD exacerbation -Continue the use of his steroids, bronchodilator management, flutter valve, mucolytic's -Currently weaned off oxygen supplementation at rest; but demonstrating desaturation with activity. -Expiratory wheezing and short winded sensation appreciated on exam. -Follow clinical response and continue supportive care.  2-type 2 diabetes, without complication and without long-term current use of insulin -A1c 6.5 -Diabetes continue has been contacted -Continue to follow CBG fluctuation; modified carbohydrate  diet, weight loss and initiation to metformin will be provided at discharge. -Continue sliding scale insulin and Semglee while inpatient.  3-gastroesophageal flux disease -Continue PPI.  4-hyperlipidemia -Continue statin.  5-hypertension -Continue home antihypertensive agent -Heart healthy diet discussed with patient.  6-morbid obesity -Low-calorie diet, portion control and increase physical activity discussed with patient -Outpatient sleep study recommended. -Body mass index is 41.6 kg/m.  7-history of palpitation -Continue treatment with acebutolol and outpatient follow-up with cardiology service.  8-hypothyroidism -Continue Synthroid.  9-history of BPH -Continue treatment with Flomax.  Subjective:  Still short winded with activity and demonstrating bilateral diffuse expiratory wheezing; overall breathing better and improving.  No chest pain, no nausea, no vomiting.  Physical Exam: Vitals:   02/13/23 0937 02/13/23 1331 02/13/23 1425 02/13/23 1700  BP: 118/67 121/67    Pulse:  77  79  Resp:  20  20  Temp:  97.9 F (36.6 C)    TempSrc:  Oral    SpO2:  90% (!) 89% 91%  Weight:      Height:       General exam: Alert, awake, oriented x 3; in no acute distress. Respiratory system: Good oxygen saturation on room air at rest; desaturating with activity.  Diffuse expiratory wheezing appreciated bilaterally; patient reported short winded sensation.  Positive scattered rhonchi. Cardiovascular system:RRR.  No rubs, no gallops, no JVD. Gastrointestinal system: Abdomen is obese, nondistended, soft and nontender. No organomegaly or masses felt. Normal bowel sounds heard. Central nervous system: Alert and oriented. No focal neurological deficits. Extremities: No cyanosis or clubbing. Skin: No petechiae. Psychiatry: Judgement and insight appear normal. Mood & affect appropriate.   Data Reviewed: TSH: 2.922 A1c: 6.5   Family Communication: Multiple friends at  bedside.  Disposition: Status is: Observation The patient remains OBS appropriate and  will d/c before 2 midnights.  Anticipating discharge back home on 02/14/2023.   Planned Discharge Destination: Home   Time spent: 50 minutes  Author: Vassie Loll, MD 02/13/2023 5:12 PM  For on call review [email or web address].

## 2023-02-13 NOTE — Plan of Care (Signed)
  Problem: Education: Goal: Knowledge of General Education information will improve Description: Including pain rating scale, medication(s)/side effects and non-pharmacologic comfort measures Outcome: Progressing   Problem: Clinical Measurements: Goal: Respiratory complications will improve Outcome: Progressing   Problem: Respiratory: Goal: Levels of oxygenation will improve Outcome: Progressing

## 2023-02-13 NOTE — Progress Notes (Signed)
Pt ambulated in hallway without O2, SaO2 remained 89-93% with exertion. Pt states no increased SOB but did state that legs felt " a little weak and shakey" once back to room. With rest, pt's SaO2 up to 95% on room air. Pt with occasional dry, non-prod cough.

## 2023-02-14 DIAGNOSIS — K219 Gastro-esophageal reflux disease without esophagitis: Secondary | ICD-10-CM | POA: Diagnosis present

## 2023-02-14 DIAGNOSIS — M109 Gout, unspecified: Secondary | ICD-10-CM | POA: Diagnosis present

## 2023-02-14 DIAGNOSIS — I10 Essential (primary) hypertension: Secondary | ICD-10-CM | POA: Diagnosis present

## 2023-02-14 DIAGNOSIS — Z8249 Family history of ischemic heart disease and other diseases of the circulatory system: Secondary | ICD-10-CM | POA: Diagnosis not present

## 2023-02-14 DIAGNOSIS — Z7982 Long term (current) use of aspirin: Secondary | ICD-10-CM | POA: Diagnosis not present

## 2023-02-14 DIAGNOSIS — Z79890 Hormone replacement therapy (postmenopausal): Secondary | ICD-10-CM | POA: Diagnosis not present

## 2023-02-14 DIAGNOSIS — N400 Enlarged prostate without lower urinary tract symptoms: Secondary | ICD-10-CM | POA: Diagnosis present

## 2023-02-14 DIAGNOSIS — Z1152 Encounter for screening for COVID-19: Secondary | ICD-10-CM | POA: Diagnosis not present

## 2023-02-14 DIAGNOSIS — J440 Chronic obstructive pulmonary disease with acute lower respiratory infection: Secondary | ICD-10-CM | POA: Diagnosis present

## 2023-02-14 DIAGNOSIS — E119 Type 2 diabetes mellitus without complications: Secondary | ICD-10-CM | POA: Diagnosis present

## 2023-02-14 DIAGNOSIS — Z794 Long term (current) use of insulin: Secondary | ICD-10-CM | POA: Diagnosis not present

## 2023-02-14 DIAGNOSIS — Z6841 Body Mass Index (BMI) 40.0 and over, adult: Secondary | ICD-10-CM | POA: Diagnosis not present

## 2023-02-14 DIAGNOSIS — J471 Bronchiectasis with (acute) exacerbation: Secondary | ICD-10-CM | POA: Diagnosis present

## 2023-02-14 DIAGNOSIS — E785 Hyperlipidemia, unspecified: Secondary | ICD-10-CM | POA: Diagnosis present

## 2023-02-14 DIAGNOSIS — J9601 Acute respiratory failure with hypoxia: Secondary | ICD-10-CM | POA: Diagnosis present

## 2023-02-14 DIAGNOSIS — Z79899 Other long term (current) drug therapy: Secondary | ICD-10-CM | POA: Diagnosis not present

## 2023-02-14 DIAGNOSIS — E66813 Obesity, class 3: Secondary | ICD-10-CM | POA: Diagnosis present

## 2023-02-14 DIAGNOSIS — E039 Hypothyroidism, unspecified: Secondary | ICD-10-CM | POA: Diagnosis present

## 2023-02-14 DIAGNOSIS — J441 Chronic obstructive pulmonary disease with (acute) exacerbation: Secondary | ICD-10-CM | POA: Diagnosis present

## 2023-02-14 LAB — BASIC METABOLIC PANEL
Anion gap: 10 (ref 5–15)
BUN: 39 mg/dL — ABNORMAL HIGH (ref 6–20)
CO2: 25 mmol/L (ref 22–32)
Calcium: 8.7 mg/dL — ABNORMAL LOW (ref 8.9–10.3)
Chloride: 99 mmol/L (ref 98–111)
Creatinine, Ser: 1.28 mg/dL — ABNORMAL HIGH (ref 0.61–1.24)
GFR, Estimated: >60 mL/min (ref >60)
Glucose, Bld: 233 mg/dL — ABNORMAL HIGH (ref 70–99)
Potassium: 4.2 mmol/L (ref 3.5–5.1)
Sodium: 134 mmol/L — ABNORMAL LOW (ref 135–145)

## 2023-02-14 LAB — GLUCOSE, CAPILLARY
Glucose-Capillary: 201 mg/dL — ABNORMAL HIGH (ref 70–99)
Glucose-Capillary: 224 mg/dL — ABNORMAL HIGH (ref 70–99)

## 2023-02-14 LAB — CBC
HCT: 41.9 % (ref 39.0–52.0)
Hemoglobin: 13.7 g/dL (ref 13.0–17.0)
MCH: 31.3 pg (ref 26.0–34.0)
MCHC: 32.7 g/dL (ref 30.0–36.0)
MCV: 95.7 fL (ref 80.0–100.0)
Platelets: 258 10*3/uL (ref 150–400)
RBC: 4.38 MIL/uL (ref 4.22–5.81)
RDW: 12.6 % (ref 11.5–15.5)
WBC: 17.2 10*3/uL — ABNORMAL HIGH (ref 4.0–10.5)
nRBC: 0 % (ref 0.0–0.2)

## 2023-02-14 MED ORDER — DOXYCYCLINE HYCLATE 100 MG PO TABS: 100.0000 mg | ORAL_TABLET | Freq: Two times a day (BID) | ORAL | 0 refills | Status: AC

## 2023-02-14 MED ORDER — LIVING WELL WITH DIABETES BOOK: Freq: Once | Status: AC

## 2023-02-14 MED ORDER — ALBUTEROL SULFATE HFA 108 (90 BASE) MCG/ACT IN AERS: 1.0000 | INHALATION_SPRAY | Freq: Four times a day (QID) | RESPIRATORY_TRACT | 0 refills | Status: DC | PRN

## 2023-02-14 MED ORDER — PANTOPRAZOLE SODIUM 40 MG PO TBEC: 40.0000 mg | DELAYED_RELEASE_TABLET | Freq: Two times a day (BID) | ORAL | 0 refills | Status: DC

## 2023-02-14 MED ORDER — FLUTICASONE FUROATE-VILANTEROL 200-25 MCG/INH IN AEPB: 1.0000 | INHALATION_SPRAY | Freq: Every day | RESPIRATORY_TRACT | 0 refills | Status: DC

## 2023-02-14 MED ORDER — DM-GUAIFENESIN ER 30-600 MG PO TB12: 1.0000 | ORAL_TABLET | Freq: Two times a day (BID) | ORAL | 0 refills | Status: DC

## 2023-02-14 MED ORDER — PREDNISONE 20 MG PO TABS: 40.0000 mg | ORAL_TABLET | Freq: Every day | ORAL | 0 refills | Status: AC

## 2023-02-14 MED ORDER — AEROCHAMBER PLUS FLO-VU MEDIUM MISC
1.0000 | Freq: Once | Status: AC
  Administered 2023-02-14: 1
  Filled 2023-02-14: qty 1

## 2023-02-14 MED ORDER — TAMSULOSIN HCL 0.4 MG PO CAPS: 0.4000 mg | ORAL_CAPSULE | ORAL | Status: DC

## 2023-02-14 MED ORDER — METFORMIN HCL 500 MG PO TABS: 500.0000 mg | ORAL_TABLET | Freq: Two times a day (BID) | ORAL | 0 refills | Status: DC

## 2023-02-14 NOTE — Discharge Summary (Signed)
Physician Discharge Summary   Patient: David Crosby MRN: [id number] DOB: [birth year]  Admit date:     02/12/2023  Discharge date: 02/14/23  Discharge Physician: Alba Cory   PCP: Rebekah Chesterfield, NP   Recommendations at discharge:    Needs close follow up with PCP for further titration of medication for DM. Will benefit of Ozempic.  Needs PFT>   Discharge Diagnoses: Principal Problem:   COPD exacerbation (HCC) Active Problems:   Hypertension   Obesity, Class III, BMI 40-49.9 (morbid obesity) (HCC)   Type 2 diabetes mellitus without complication, with long-term current use of insulin (HCC)   GERD (gastroesophageal reflux disease)   Acute respiratory failure with hypoxia (HCC)   Hyperlipidemia   Hypothyroidism   BPH (benign prostatic hyperplasia)  Resolved Problems:   * No resolved hospital problems. [facility] Course: SHYRON WAHLQUIST is a 57 y.o. male with medical history significant of gastroesophageal reflux disease, hypothyroidism, hypertension, hyperlipidemia, history of COPD/asthma, class III obesity and palpitation; presented to the hospital secondary to shortness of breath and dry coughing spells.  Symptoms have been present for the last week or so but worsening in the last 24-48 hours prior to admission.  Patient reports no fever, no nausea, no vomiting, no sick contacts, no dysuria, no hematuria, no focal weaknesses.   Patient also expressed no chest pain or orthopnea; and reported that shortness of breath is worse with exertion.   Workup in the ED demonstrating diffuse expiratory wheezing with positive rhonchi.  Chest x-ray without acute infiltrates, vascular congestion or opacities; positive bronchitic changes appreciated.   Improvement in his symptoms after nebulizer treatment provided.  3-4 L nasal cannula supplementation in place to maintain saturation as patient was in the mid 80s on room air.   TRH consulted to place patient in the hospital for  further evaluation and management of COPD/bronchiectasis exacerbation.  Assessment and Plan: 1-Acute respiratory failure with hypoxia -In the setting of COPD exacerbation/ Vs Asthma exacerbation.  -He denies history of smoking.  -improved with nebulizer and steroids.  Discharge on Breo, Albuterol, Doxycycline, prednisone for 4 days.  Stable for discharge.  Needs PFT>   2-Type 2 diabetes, without complication and without long-term current use of insulin -A1c 6.5 -Diabetes continue has been contacted -He received insulin the hospital.  Discharge on Metformin.   3-Gastroesophageal flux disease -Continue PPI.   4-hyperlipidemia -Continue statin.   5-hypertension -Continue home antihypertensive agent    6-morbid obesity -Low-calorie diet, portion control and increase physical activity discussed with patient -Outpatient sleep study recommended. -Body mass index is 41.6 kg/m.   7-history of palpitation -Continue treatment with acebutolol and outpatient follow-up with cardiology service.   8-hypothyroidism -Continue Synthroid.   9-history of BPH -Continue treatment with Flomax.          Consultants: None Procedures performed: None Disposition: Home Diet recommendation:  Discharge Diet Orders (From admission, onward)     Start     Ordered   02/14/23 0000  Diet Carb Modified        02/14/23 1238           Carb modified diet DISCHARGE MEDICATION: Allergies as of 02/14/2023   No Known Allergies      Medication List     STOP taking these medications    lisinopril 20 MG tablet Commonly known as: ZESTRIL   phenazopyridine 200 MG tablet Commonly known as: Pyridium   Trospium Chloride 60 MG Cp24  TAKE these medications    acebutolol 200 MG capsule Commonly known as: SECTRAL TAKE 2 CAPSULES BY MOUTH IN THE MORNING AND 1 IN THE EVENING MUST  HAVE  OFFICE  VISIT  FOR  ADDITIONAL  REFILLS   albuterol 108 (90 Base) MCG/ACT inhaler Commonly  known as: VENTOLIN HFA Inhale 1 puff into the lungs every 6 (six) hours as needed for wheezing or shortness of breath.   amLODipine 5 MG tablet Commonly known as: NORVASC Take 1.5 tablets (7.5 mg total) by mouth daily.   aspirin 81 MG tablet Take 81 mg by mouth daily. Reported on 06/11/2015   dextromethorphan-guaiFENesin 30-600 MG 12hr tablet Commonly known as: MUCINEX DM Take 1 tablet by mouth 2 (two) times daily.   doxycycline 100 MG tablet Commonly known as: VIBRA-TABS Take 1 tablet (100 mg total) by mouth every 12 (twelve) hours for 5 days.   febuxostat 40 MG tablet Commonly known as: ULORIC Take 80 mg by mouth daily.   fluticasone furoate-vilanterol 200-25 MCG/INH Aepb Commonly known as: BREO ELLIPTA Inhale 1 puff into the lungs daily. What changed:  when to take this reasons to take this   levothyroxine 100 MCG tablet Commonly known as: SYNTHROID Take 100 mcg by mouth daily before breakfast.   metFORMIN 500 MG tablet Commonly known as: GLUCOPHAGE Take 1 tablet (500 mg total) by mouth 2 (two) times daily with a meal.   pantoprazole 40 MG tablet Commonly known as: PROTONIX Take 1 tablet (40 mg total) by mouth 2 (two) times daily.   predniSONE 20 MG tablet Commonly known as: DELTASONE Take 2 tablets (40 mg total) by mouth daily for 4 days.   rosuvastatin 10 MG tablet Commonly known as: CRESTOR Take 1 tablet (10 mg total) by mouth daily.   tamsulosin 0.4 MG Caps capsule Commonly known as: FLOMAX Take 1 capsule (0.4 mg total) by mouth daily after supper.   traMADol 50 MG tablet Commonly known as: Ultram Take 1-2 tablets (50-100 mg total) by mouth every 6 (six) hours as needed for moderate pain.        Follow-up Information     Rebekah Chesterfield, NP Follow up in 1 week(s).   Specialty: Internal Medicine Contact information: [address] [place] [address] [phone]                Discharge Exam: Ceasar Mons Weights   02/12/23 1331  02/12/23 1841  Weight: 133.8 kg 135.3 kg   General; NAD Lungs CTA  Condition at discharge: stable  The results of significant diagnostics from this hospitalization (including imaging, microbiology, ancillary and laboratory) are listed below for reference.   Imaging Studies: DG Chest 2 View  Result Date: 02/12/2023 CLINICAL DATA:  Shortness of breath EXAM: CHEST - 2 VIEW COMPARISON:  X-ray 06/30/2022. FINDINGS: Film is under penetrated. No consolidation, pneumothorax or effusion. No edema. Normal cardiopericardial silhouette when adjusting for technique. Overlapping cardiac leads. The inferior costophrenic angles are clipped off the edge of the film on lateral view. Endplate osteophytes identified along the spine. IMPRESSION: Limited lateral view. Grossly no acute cardiopulmonary disease. Under penetrated radiograph Electronically Signed   By: Karen Kays M.D.   On: 02/12/2023 10:45    Microbiology: Results for orders placed or performed during the hospital encounter of 02/12/23  Resp panel by RT-PCR (RSV, Flu A&B, Covid) Anterior Nasal Swab     Status: None   Collection Time: 02/12/23 10:00 AM   Specimen: Anterior Nasal Swab  Result  Value Ref Range Status   SARS Coronavirus 2 by RT PCR NEGATIVE NEGATIVE Final    Comment: (NOTE) SARS-CoV-2 target nucleic acids are NOT DETECTED.  The SARS-CoV-2 RNA is generally detectable in upper respiratory specimens during the acute phase of infection. The lowest concentration of SARS-CoV-2 viral copies this assay can detect is 138 copies/mL. A negative result does not preclude SARS-Cov-2 infection and should not be used as the sole basis for treatment or other patient management decisions. A negative result may occur with  improper specimen collection/handling, submission of specimen other than nasopharyngeal swab, presence of viral mutation(s) within the areas targeted by this assay, and inadequate number of viral copies(<138 copies/mL). A  negative result must be combined with clinical observations, patient history, and epidemiological information. The expected result is Negative.  Fact Sheet for Patients:  BloggerCourse.com  Fact Sheet for Healthcare Providers:  SeriousBroker.it  This test is no t yet approved or cleared by the [place] FDA and  has been authorized for detection and/or diagnosis of SARS-CoV-2 by FDA under an Emergency Use Authorization (EUA). This EUA will remain  in effect (meaning this test can be used) for the duration of the COVID-19 declaration under Section 564(b)(1) of the Act, 21 U.S.C.section 360bbb-3(b)(1), unless the authorization is terminated  or revoked sooner.       Influenza A by PCR NEGATIVE NEGATIVE Final   Influenza B by PCR NEGATIVE NEGATIVE Final    Comment: (NOTE) The Xpert Xpress SARS-CoV-2/FLU/RSV plus assay is intended as an aid in the diagnosis of influenza from Nasopharyngeal swab specimens and should not be used as a sole basis for treatment. Nasal washings and aspirates are unacceptable for Xpert Xpress SARS-CoV-2/FLU/RSV testing.  Fact Sheet for Patients: BloggerCourse.com  Fact Sheet for Healthcare Providers: SeriousBroker.it  This test is not yet approved or cleared by the [place] FDA and has been authorized for detection and/or diagnosis of SARS-CoV-2 by FDA under an Emergency Use Authorization (EUA). This EUA will remain in effect (meaning this test can be used) for the duration of the COVID-19 declaration under Section 564(b)(1) of the Act, 21 U.S.C. section 360bbb-3(b)(1), unless the authorization is terminated or revoked.     Resp Syncytial Virus by PCR NEGATIVE NEGATIVE Final    Comment: (NOTE) Fact Sheet for Patients: BloggerCourse.com  Fact Sheet for Healthcare  Providers: SeriousBroker.it  This test is not yet approved or cleared by the [place] FDA and has been authorized for detection and/or diagnosis of SARS-CoV-2 by FDA under an Emergency Use Authorization (EUA). This EUA will remain in effect (meaning this test can be used) for the duration of the COVID-19 declaration under Section 564(b)(1) of the Act, 21 U.S.C. section 360bbb-3(b)(1), unless the authorization is terminated or revoked.  Performed at [facility], [address], [place], [place] [address]     Labs: CBC: Recent Labs  Lab 02/12/23 0955 02/14/23 0344  WBC 10.7* 17.2*  NEUTROABS 8.6*  --   HGB 16.2 13.7  HCT 47.9 41.9  MCV 91.4 95.7  PLT 251 258   Basic Metabolic Panel: Recent Labs  Lab 02/12/23 0955 02/12/23 1300 02/14/23 0344  NA 136  --  134*  K 4.9  --  4.2  CL 101  --  99  CO2 26  --  25  GLUCOSE 150*  --  233*  BUN 18  --  39*  CREATININE 1.03  --  1.28*  CALCIUM 9.1  --  8.7*  MG  --  1.8  --    Liver Function Tests: Recent Labs  Lab 02/12/23 0955  AST 44*  ALT 54*  ALKPHOS 56  BILITOT 0.7  PROT 8.1  ALBUMIN 4.2   CBG: Recent Labs  Lab 02/13/23 1153 02/13/23 1603 02/13/23 2106 02/14/23 0726 02/14/23 1142  GLUCAP 190* 259* 297* 201* 224*    Discharge time spent: greater than 30 minutes.  Signed: Alba Cory, MD [facility] 02/14/2023

## 2023-02-14 NOTE — Inpatient Diabetes Management (Signed)
Inpatient Diabetes Program Recommendations  AACE/ADA: New Consensus Statement on Inpatient Glycemic Control (2015)  Target Ranges:  Prepandial:   less than 140 mg/dL      Peak postprandial:   less than 180 mg/dL (1-2 hours)      Critically ill patients:  140 - 180 mg/dL   Lab Results  Component Value Date   GLUCAP 201 (H) 02/14/2023   HGBA1C 6.5 (H) 02/12/2023    Review of Glycemic Control  Latest Reference Range & Units 02/13/23 08:26 02/13/23 11:53 02/13/23 16:03 02/13/23 21:06 02/14/23 07:26  Glucose-Capillary 70 - 99 mg/dL 440 (H) 347 (H) 425 (H) 297 (H) 201 (H)  (H): Data is abnormally high  Diabetes history: Newly diagnosed with T2DM Outpatient Diabetes medications: none Current orders for Inpatient glycemic control: Semglee 5 units every day, Novolog 0-9 units TID and 0-5 units at bedtime, Solumedrol 40 mg Q12H  Received referral for newly diagnosed DM2.  Ordered the LWWD booklet.  Attempted to call patient but no answer.  Will try again later.    Please consider for discharge: Metformin 500 mg BID.    This patient would be a good candidate for a GLP-1RA given his weight and new diagnoses of DM.  ADA standards recommend start with a GLP-1 RA with new onset DM with or without Metformin and A1C <10%.  He should speak with his PCP about this.    Will continue to follow while inpatient.  Thank you, Dulce Sellar, MSN, CDCES Diabetes Coordinator Inpatient Diabetes Program [phone] (team pager from 8a-5p)

## 2023-04-09 ENCOUNTER — Other Ambulatory Visit: Payer: Self-pay

## 2023-04-09 ENCOUNTER — Inpatient Hospital Stay (HOSPITAL_COMMUNITY)
Admission: EM | Admit: 2023-04-09 | Discharge: 2023-04-11 | DRG: 193 | Disposition: A | Discharge status: Home / Self Care | Payer: Medicare Other | Attending: Family Medicine | Admitting: Family Medicine

## 2023-04-09 ENCOUNTER — Emergency Department (HOSPITAL_COMMUNITY): Payer: Medicare Other

## 2023-04-09 ENCOUNTER — Encounter (HOSPITAL_COMMUNITY): Payer: Self-pay

## 2023-04-09 DIAGNOSIS — Z7984 Long term (current) use of oral hypoglycemic drugs: Secondary | ICD-10-CM | POA: Diagnosis not present

## 2023-04-09 DIAGNOSIS — Z820 Family history of epilepsy and other diseases of the nervous system: Secondary | ICD-10-CM

## 2023-04-09 DIAGNOSIS — Z1152 Encounter for screening for COVID-19: Secondary | ICD-10-CM | POA: Diagnosis not present

## 2023-04-09 DIAGNOSIS — Z7982 Long term (current) use of aspirin: Secondary | ICD-10-CM

## 2023-04-09 DIAGNOSIS — M109 Gout, unspecified: Secondary | ICD-10-CM | POA: Diagnosis present

## 2023-04-09 DIAGNOSIS — I10 Essential (primary) hypertension: Secondary | ICD-10-CM | POA: Diagnosis not present

## 2023-04-09 DIAGNOSIS — E039 Hypothyroidism, unspecified: Secondary | ICD-10-CM | POA: Diagnosis present

## 2023-04-09 DIAGNOSIS — Z79899 Other long term (current) drug therapy: Secondary | ICD-10-CM

## 2023-04-09 DIAGNOSIS — R0902 Hypoxemia: Secondary | ICD-10-CM

## 2023-04-09 DIAGNOSIS — Z8249 Family history of ischemic heart disease and other diseases of the circulatory system: Secondary | ICD-10-CM

## 2023-04-09 DIAGNOSIS — J9601 Acute respiratory failure with hypoxia: Secondary | ICD-10-CM | POA: Diagnosis present

## 2023-04-09 DIAGNOSIS — I429 Cardiomyopathy, unspecified: Secondary | ICD-10-CM | POA: Diagnosis present

## 2023-04-09 DIAGNOSIS — Z8701 Personal history of pneumonia (recurrent): Secondary | ICD-10-CM

## 2023-04-09 DIAGNOSIS — E785 Hyperlipidemia, unspecified: Secondary | ICD-10-CM | POA: Diagnosis present

## 2023-04-09 DIAGNOSIS — N182 Chronic kidney disease, stage 2 (mild): Secondary | ICD-10-CM | POA: Diagnosis present

## 2023-04-09 DIAGNOSIS — Z6841 Body Mass Index (BMI) 40.0 and over, adult: Secondary | ICD-10-CM

## 2023-04-09 DIAGNOSIS — I129 Hypertensive chronic kidney disease with stage 1 through stage 4 chronic kidney disease, or unspecified chronic kidney disease: Secondary | ICD-10-CM | POA: Diagnosis present

## 2023-04-09 DIAGNOSIS — Z79890 Hormone replacement therapy (postmenopausal): Secondary | ICD-10-CM

## 2023-04-09 DIAGNOSIS — J101 Influenza due to other identified influenza virus with other respiratory manifestations: Principal | ICD-10-CM

## 2023-04-09 DIAGNOSIS — Z603 Acculturation difficulty: Secondary | ICD-10-CM | POA: Diagnosis present

## 2023-04-09 DIAGNOSIS — K219 Gastro-esophageal reflux disease without esophagitis: Secondary | ICD-10-CM | POA: Insufficient documentation

## 2023-04-09 DIAGNOSIS — J441 Chronic obstructive pulmonary disease with (acute) exacerbation: Secondary | ICD-10-CM | POA: Diagnosis present

## 2023-04-09 DIAGNOSIS — E119 Type 2 diabetes mellitus without complications: Secondary | ICD-10-CM

## 2023-04-09 DIAGNOSIS — N400 Enlarged prostate without lower urinary tract symptoms: Secondary | ICD-10-CM | POA: Diagnosis present

## 2023-04-09 LAB — COMPREHENSIVE METABOLIC PANEL
ALT: 63 U/L — ABNORMAL HIGH (ref 0–44)
AST: 70 U/L — ABNORMAL HIGH (ref 15–41)
Albumin: 4 g/dL (ref 3.5–5.0)
Alkaline Phosphatase: 50 U/L (ref 38–126)
Anion gap: 13 (ref 5–15)
BUN: 14 mg/dL (ref 6–20)
CO2: 25 mmol/L (ref 22–32)
Calcium: 8.8 mg/dL — ABNORMAL LOW (ref 8.9–10.3)
Chloride: 98 mmol/L (ref 98–111)
Creatinine, Ser: 1.08 mg/dL (ref 0.61–1.24)
GFR, Estimated: >60 mL/min (ref >60)
Glucose, Bld: 144 mg/dL — ABNORMAL HIGH (ref 70–99)
Potassium: 4.3 mmol/L (ref 3.5–5.1)
Sodium: 136 mmol/L (ref 135–145)
Total Bilirubin: 1 mg/dL (ref 0.0–1.2)
Total Protein: 7.6 g/dL (ref 6.5–8.1)

## 2023-04-09 LAB — CBC WITH DIFFERENTIAL/PLATELET
Abs Immature Granulocytes: 0.03 10*3/uL (ref 0.00–0.07)
Basophils Absolute: 0.1 10*3/uL (ref 0.0–0.1)
Basophils Relative: 1 %
Eosinophils Absolute: 0.2 10*3/uL (ref 0.0–0.5)
Eosinophils Relative: 2 %
HCT: 44 % (ref 39.0–52.0)
Hemoglobin: 14.8 g/dL (ref 13.0–17.0)
Immature Granulocytes: 0 %
Lymphocytes Relative: 3 %
Lymphs Abs: 0.3 10*3/uL — ABNORMAL LOW (ref 0.7–4.0)
MCH: 31 pg (ref 26.0–34.0)
MCHC: 33.6 g/dL (ref 30.0–36.0)
MCV: 92.2 fL (ref 80.0–100.0)
Monocytes Absolute: 0.8 10*3/uL (ref 0.1–1.0)
Monocytes Relative: 8 %
Neutro Abs: 8.7 10*3/uL — ABNORMAL HIGH (ref 1.7–7.7)
Neutrophils Relative %: 86 %
Platelets: 214 10*3/uL (ref 150–400)
RBC: 4.77 MIL/uL (ref 4.22–5.81)
RDW: 12.6 % (ref 11.5–15.5)
WBC: 10.1 10*3/uL (ref 4.0–10.5)
nRBC: 0 % (ref 0.0–0.2)

## 2023-04-09 LAB — LACTIC ACID, PLASMA
Lactic Acid, Venous: 2.9 mmol/L (ref 0.5–1.9)
Lactic Acid, Venous: 2.8 mmol/L (ref 0.5–1.9)

## 2023-04-09 LAB — PROTIME-INR
INR: 1 (ref 0.8–1.2)
Prothrombin Time: 13.7 s (ref 11.4–15.2)

## 2023-04-09 LAB — APTT: aPTT: 26 s (ref 24–36)

## 2023-04-09 LAB — RESP PANEL BY RT-PCR (RSV, FLU A&B, COVID)  RVPGX2
Influenza A by PCR: POSITIVE — AB
Influenza B by PCR: NEGATIVE
Resp Syncytial Virus by PCR: NEGATIVE
SARS Coronavirus 2 by RT PCR: NEGATIVE

## 2023-04-09 LAB — CBG MONITORING, ED: Glucose-Capillary: 229 mg/dL — ABNORMAL HIGH (ref 70–99)

## 2023-04-09 MED ORDER — ASPIRIN 81 MG PO TBEC
81.0000 mg | DELAYED_RELEASE_TABLET | Freq: Every day | ORAL | Status: DC
Start: 2023-04-10 — End: 2023-04-11
  Administered 2023-04-10 – 2023-04-11 (×2): 81 mg via ORAL
  Filled 2023-04-09 (×2): qty 1

## 2023-04-09 MED ORDER — SODIUM CHLORIDE 0.9 % IV SOLN: INTRAVENOUS | Status: AC

## 2023-04-09 MED ORDER — TRAMADOL HCL 50 MG PO TABS
50.0000 mg | ORAL_TABLET | Freq: Four times a day (QID) | ORAL | Status: DC | PRN
  Administered 2023-04-10: 50 mg via ORAL
  Administered 2023-04-10: 100 mg via ORAL
  Administered 2023-04-10: 50 mg via ORAL
  Filled 2023-04-09 (×2): qty 1
  Filled 2023-04-09: qty 2

## 2023-04-09 MED ORDER — FEBUXOSTAT 40 MG PO TABS
80.0000 mg | ORAL_TABLET | Freq: Every day | ORAL | Status: DC
  Administered 2023-04-10 – 2023-04-11 (×2): 80 mg via ORAL
  Filled 2023-04-09 (×2): qty 2

## 2023-04-09 MED ORDER — LEVOTHYROXINE SODIUM 112 MCG PO TABS
112.0000 ug | ORAL_TABLET | Freq: Every day | ORAL | Status: DC
  Administered 2023-04-10 – 2023-04-11 (×2): 112 ug via ORAL
  Filled 2023-04-09 (×2): qty 1

## 2023-04-09 MED ORDER — ROSUVASTATIN CALCIUM 10 MG PO TABS
10.0000 mg | ORAL_TABLET | Freq: Every day | ORAL | Status: DC
  Administered 2023-04-10 – 2023-04-11 (×2): 10 mg via ORAL
  Filled 2023-04-09 (×2): qty 1

## 2023-04-09 MED ORDER — TAMSULOSIN HCL 0.4 MG PO CAPS
0.4000 mg | ORAL_CAPSULE | ORAL | Status: DC
  Administered 2023-04-10: 0.4 mg via ORAL
  Filled 2023-04-09: qty 1

## 2023-04-09 MED ORDER — MAGNESIUM SULFATE 2 GM/50ML IV SOLN
2.0000 g | Freq: Once | INTRAVENOUS | Status: AC
  Administered 2023-04-09: 2 g via INTRAVENOUS
  Filled 2023-04-09: qty 50

## 2023-04-09 MED ORDER — PANTOPRAZOLE SODIUM 40 MG PO TBEC
40.0000 mg | DELAYED_RELEASE_TABLET | Freq: Two times a day (BID) | ORAL | Status: DC
  Administered 2023-04-10 – 2023-04-11 (×4): 40 mg via ORAL
  Filled 2023-04-09 (×4): qty 1

## 2023-04-09 MED ORDER — MAGNESIUM HYDROXIDE 400 MG/5ML PO SUSP: 30.0000 mL | Freq: Every day | ORAL | Status: DC | PRN

## 2023-04-09 MED ORDER — TRAZODONE HCL 50 MG PO TABS
25.0000 mg | ORAL_TABLET | Freq: Every evening | ORAL | Status: DC | PRN
Start: 2023-04-09 — End: 2023-04-11
  Administered 2023-04-10 (×2): 25 mg via ORAL
  Filled 2023-04-09 (×2): qty 1

## 2023-04-09 MED ORDER — INSULIN ASPART 100 UNIT/ML IJ SOLN
0.0000 [IU] | Freq: Three times a day (TID) | INTRAMUSCULAR | Status: DC
  Administered 2023-04-09 – 2023-04-10 (×2): 7 [IU] via SUBCUTANEOUS
  Administered 2023-04-10 – 2023-04-11 (×3): 4 [IU] via SUBCUTANEOUS
  Filled 2023-04-09 (×2): qty 1

## 2023-04-09 MED ORDER — ACETAMINOPHEN 325 MG PO TABS: 650.0000 mg | ORAL_TABLET | Freq: Four times a day (QID) | ORAL | Status: DC | PRN

## 2023-04-09 MED ORDER — HYDROCOD POLI-CHLORPHE POLI ER 10-8 MG/5ML PO SUER
5.0000 mL | Freq: Two times a day (BID) | ORAL | Status: DC | PRN
  Administered 2023-04-10 (×2): 5 mL via ORAL
  Filled 2023-04-09 (×2): qty 5

## 2023-04-09 MED ORDER — ACEBUTOLOL HCL 200 MG PO CAPS
200.0000 mg | ORAL_CAPSULE | Freq: Two times a day (BID) | ORAL | Status: DC
  Administered 2023-04-10 – 2023-04-11 (×3): 200 mg via ORAL
  Filled 2023-04-09 (×7): qty 1

## 2023-04-09 MED ORDER — ENOXAPARIN SODIUM 80 MG/0.8ML IJ SOSY
0.5000 mg/kg | PREFILLED_SYRINGE | INTRAMUSCULAR | Status: DC
  Administered 2023-04-09 – 2023-04-10 (×2): 65 mg via SUBCUTANEOUS
  Filled 2023-04-09 (×2): qty 0.8

## 2023-04-09 MED ORDER — SODIUM CHLORIDE 0.9 % IV BOLUS
2000.0000 mL | Freq: Once | INTRAVENOUS | Status: AC
  Administered 2023-04-09: 2000 mL via INTRAVENOUS

## 2023-04-09 MED ORDER — IPRATROPIUM-ALBUTEROL 0.5-2.5 (3) MG/3ML IN SOLN
3.0000 mL | Freq: Once | RESPIRATORY_TRACT | Status: AC
  Administered 2023-04-09: 3 mL via RESPIRATORY_TRACT
  Filled 2023-04-09: qty 3

## 2023-04-09 MED ORDER — ACETAMINOPHEN 325 MG PO TABS
650.0000 mg | ORAL_TABLET | Freq: Once | ORAL | Status: AC | PRN
  Administered 2023-04-09: 650 mg via ORAL
  Filled 2023-04-09: qty 2

## 2023-04-09 MED ORDER — METHYLPREDNISOLONE SODIUM SUCC 125 MG IJ SOLR
125.0000 mg | Freq: Once | INTRAMUSCULAR | Status: AC
  Administered 2023-04-09: 125 mg via INTRAVENOUS
  Filled 2023-04-09: qty 2

## 2023-04-09 MED ORDER — ALBUTEROL SULFATE (2.5 MG/3ML) 0.083% IN NEBU: 2.5000 mg | INHALATION_SOLUTION | Freq: Four times a day (QID) | RESPIRATORY_TRACT | Status: DC | PRN

## 2023-04-09 MED ORDER — SODIUM CHLORIDE 0.9 % IV SOLN
2.0000 g | INTRAVENOUS | Status: DC
  Administered 2023-04-10: 2 g via INTRAVENOUS
  Filled 2023-04-09: qty 20

## 2023-04-09 MED ORDER — METHYLPREDNISOLONE SODIUM SUCC 40 MG IJ SOLR
40.0000 mg | Freq: Two times a day (BID) | INTRAMUSCULAR | Status: AC
  Administered 2023-04-10 (×2): 40 mg via INTRAVENOUS
  Filled 2023-04-09 (×2): qty 1

## 2023-04-09 MED ORDER — ALBUTEROL SULFATE (2.5 MG/3ML) 0.083% IN NEBU
2.5000 mg | INHALATION_SOLUTION | Freq: Once | RESPIRATORY_TRACT | Status: AC
  Administered 2023-04-09: 2.5 mg via RESPIRATORY_TRACT
  Filled 2023-04-09: qty 3

## 2023-04-09 MED ORDER — GUAIFENESIN ER 600 MG PO TB12
600.0000 mg | ORAL_TABLET | Freq: Two times a day (BID) | ORAL | Status: DC
Start: 2023-04-09 — End: 2023-04-11
  Administered 2023-04-09 – 2023-04-10 (×3): 600 mg via ORAL
  Filled 2023-04-09 (×4): qty 1

## 2023-04-09 MED ORDER — SODIUM CHLORIDE 0.9 % IV SOLN
500.0000 mg | Freq: Once | INTRAVENOUS | Status: AC
  Administered 2023-04-09: 500 mg via INTRAVENOUS
  Filled 2023-04-09: qty 5

## 2023-04-09 MED ORDER — GUAIFENESIN ER 600 MG PO TB12: 600.0000 mg | ORAL_TABLET | Freq: Two times a day (BID) | ORAL | Status: DC

## 2023-04-09 MED ORDER — IPRATROPIUM-ALBUTEROL 0.5-2.5 (3) MG/3ML IN SOLN
3.0000 mL | Freq: Four times a day (QID) | RESPIRATORY_TRACT | Status: DC
  Administered 2023-04-09 – 2023-04-11 (×7): 3 mL via RESPIRATORY_TRACT
  Filled 2023-04-09 (×7): qty 3

## 2023-04-09 MED ORDER — SODIUM CHLORIDE 0.9 % IV SOLN
2.0000 g | Freq: Once | INTRAVENOUS | Status: AC
  Administered 2023-04-09: 2 g via INTRAVENOUS
  Filled 2023-04-09: qty 20

## 2023-04-09 MED ORDER — DM-GUAIFENESIN ER 30-600 MG PO TB12
1.0000 | ORAL_TABLET | Freq: Two times a day (BID) | ORAL | Status: DC
  Administered 2023-04-09: 1 via ORAL
  Filled 2023-04-09: qty 1

## 2023-04-09 MED ORDER — AMLODIPINE BESYLATE 5 MG PO TABS
7.5000 mg | ORAL_TABLET | Freq: Every day | ORAL | Status: DC
  Administered 2023-04-10 – 2023-04-11 (×2): 7.5 mg via ORAL
  Filled 2023-04-09 (×2): qty 2

## 2023-04-09 MED ORDER — PREDNISONE 20 MG PO TABS
40.0000 mg | ORAL_TABLET | Freq: Every day | ORAL | Status: DC
  Filled 2023-04-09: qty 2

## 2023-04-09 MED ORDER — OSELTAMIVIR PHOSPHATE 75 MG PO CAPS
75.0000 mg | ORAL_CAPSULE | Freq: Two times a day (BID) | ORAL | Status: DC
  Administered 2023-04-09 – 2023-04-11 (×4): 75 mg via ORAL
  Filled 2023-04-09 (×4): qty 1

## 2023-04-09 MED ORDER — ACETAMINOPHEN 650 MG RE SUPP: 650.0000 mg | Freq: Four times a day (QID) | RECTAL | Status: DC | PRN

## 2023-04-09 MED ORDER — ONDANSETRON HCL 4 MG/2ML IJ SOLN
4.0000 mg | Freq: Once | INTRAMUSCULAR | Status: AC
  Administered 2023-04-09: 4 mg via INTRAVENOUS
  Filled 2023-04-09: qty 2

## 2023-04-09 NOTE — ED Notes (Signed)
Dr. Arville Care in the room with the patient, admission plans discussed with patient.

## 2023-04-09 NOTE — Progress Notes (Signed)
Elink monitoring for the code sepsis protocol.

## 2023-04-09 NOTE — Consult Note (Signed)
CODE SEPSIS - PHARMACY COMMUNICATION  **Broad Spectrum Antibiotics should be administered within 1 hour of Sepsis diagnosis**  Time Code Sepsis Called/Page Received: 1854  Antibiotics Ordered: Ceftriaxone & Azithromycin   Time of 1st antibiotic administration: 1932  Additional action taken by pharmacy: Contacted RN  If necessary, Name of Provider/Nurse Contacted: [place] Welch   Kailoni Vahle, PharmD Pharmacy Resident  04/09/2023 6:54 PM
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
## 2023-04-09 NOTE — ED Provider Notes (Signed)
[place] [facility] DEPARTMENT AT [facility] Provider Note   CSN: [id number] Arrival date & time: 04/09/23  1711     History  Chief Complaint  Patient presents with   Shortness of Breath    David Crosby is a 58 y.o. male.  Patient has a history of COPD.  He states he has had a cough and shortness of breath for couple days.  Patient also complains of fever  The history is provided by the patient and medical records. A language interpreter was used.  Shortness of Breath Severity:  Moderate Onset quality:  Sudden Timing:  Constant Progression:  Worsening Chronicity:  Recurrent Relieved by:  Nothing Worsened by:  Nothing Ineffective treatments:  None tried Associated symptoms: no abdominal pain, no chest pain, no cough, no headaches and no rash        Home Medications Prior to Admission medications   Medication Sig Start Date End Date Taking? Authorizing Provider  acebutolol (SECTRAL) 200 MG capsule TAKE 2 CAPSULES BY MOUTH IN THE MORNING AND 1 IN THE EVENING MUST  HAVE  OFFICE  VISIT  FOR  ADDITIONAL  REFILLS 02/05/17   Antoine Poche, MD  albuterol (VENTOLIN HFA) 108 (90 Base) MCG/ACT inhaler Inhale 1 puff into the lungs every 6 (six) hours as needed for wheezing or shortness of breath. 02/14/23   Regalado, Belkys A, MD  amLODipine (NORVASC) 5 MG tablet Take 1.5 tablets (7.5 mg total) by mouth daily. 04/08/22   Carlos Levering, NP  aspirin 81 MG tablet Take 81 mg by mouth daily. Reported on 06/11/2015    [provider]  dextromethorphan-guaiFENesin (MUCINEX DM) 30-600 MG 12hr tablet Take 1 tablet by mouth 2 (two) times daily. 02/14/23   Regalado, Belkys A, MD  febuxostat (ULORIC) 40 MG tablet Take 80 mg by mouth daily.     [provider]  fluticasone furoate-vilanterol (BREO ELLIPTA) 200-25 MCG/INH AEPB Inhale 1 puff into the lungs daily. 02/14/23   Regalado, Belkys A, MD  levothyroxine (SYNTHROID, LEVOTHROID) 100 MCG tablet Take 100 mcg  by mouth daily before breakfast.    [provider]  metFORMIN (GLUCOPHAGE) 500 MG tablet Take 1 tablet (500 mg total) by mouth 2 (two) times daily with a meal. 02/14/23   Regalado, Belkys A, MD  pantoprazole (PROTONIX) 40 MG tablet Take 1 tablet (40 mg total) by mouth 2 (two) times daily. 02/14/23   Regalado, Belkys A, MD  rosuvastatin (CRESTOR) 10 MG tablet Take 1 tablet (10 mg total) by mouth daily. 08/26/22   Rollene Rotunda, MD  tamsulosin (FLOMAX) 0.4 MG CAPS capsule Take 1 capsule (0.4 mg total) by mouth daily after supper. 02/14/23   Regalado, Belkys A, MD  traMADol (ULTRAM) 50 MG tablet Take 1-2 tablets (50-100 mg total) by mouth every 6 (six) hours as needed for moderate pain. 05/23/15   Crist Fat, MD      Allergies    Patient has no allergy information on record.    Review of Systems   Review of Systems  Constitutional:  Negative for appetite change and fatigue.  HENT:  Negative for congestion, ear discharge and sinus pressure.   Eyes:  Negative for discharge.  Respiratory:  Positive for shortness of breath. Negative for cough.   Cardiovascular:  Negative for chest pain.  Gastrointestinal:  Negative for abdominal pain and diarrhea.  Genitourinary:  Negative for frequency and hematuria.  Musculoskeletal:  Negative for back pain.  Skin:  Negative for rash.  Neurological:  Negative for seizures and headaches.  Psychiatric/Behavioral:  Negative for hallucinations.     Physical Exam Updated Vital Signs BP (!) 119/57 (BP Location: Left Arm)   Pulse 89   Temp 98.7 F (37.1 C) (Oral)   Resp 16   Ht 5\' 11"  (1.803 m)   Wt 131.5 kg   SpO2 95%   BMI 40.45 kg/m  Physical Exam Vitals and nursing note reviewed.  Constitutional:      Appearance: He is well-developed.  HENT:     Head: Normocephalic.     Nose: Nose normal.  Eyes:     General: No scleral icterus.    Conjunctiva/sclera: Conjunctivae normal.  Neck:     Thyroid: No thyromegaly.  Cardiovascular:      Rate and Rhythm: Normal rate and regular rhythm.     Heart sounds: No murmur heard.    No friction rub. No gallop.  Pulmonary:     Breath sounds: No stridor. Wheezing present. No rales.  Chest:     Chest wall: No tenderness.  Abdominal:     General: There is no distension.     Tenderness: There is no abdominal tenderness. There is no rebound.  Musculoskeletal:        General: Normal range of motion.     Cervical back: Neck supple.  Lymphadenopathy:     Cervical: No cervical adenopathy.  Skin:    Findings: No erythema or rash.  Neurological:     Mental Status: He is alert and oriented to person, place, and time.     Motor: No abnormal muscle tone.     Coordination: Coordination normal.  Psychiatric:        Behavior: Behavior normal.     ED Results / Procedures / Treatments   Labs (all labs ordered are listed, but only abnormal results are displayed) Labs Reviewed  RESP PANEL BY RT-PCR (RSV, FLU A&B, COVID)  RVPGX2 - Abnormal; Notable for the following components:      Result Value   Influenza A by PCR POSITIVE (*)    All other components within normal limits  LACTIC ACID, PLASMA - Abnormal; Notable for the following components:   Lactic Acid, Venous 2.9 (*)    All other components within normal limits  COMPREHENSIVE METABOLIC PANEL - Abnormal; Notable for the following components:   Glucose, Bld 144 (*)    Calcium 8.8 (*)    AST 70 (*)    ALT 63 (*)    All other components within normal limits  CBC WITH DIFFERENTIAL/PLATELET - Abnormal; Notable for the following components:   Neutro Abs 8.7 (*)    Lymphs Abs 0.3 (*)    All other components within normal limits  CULTURE, BLOOD (ROUTINE X 2)  CULTURE, BLOOD (ROUTINE X 2)  PROTIME-INR  APTT  LACTIC ACID, PLASMA    EKG EKG Interpretation Date/Time:  Thursday April 09 2023 18:33:50 EST Ventricular Rate:  105 PR Interval:  140 QRS Duration:  103 QT Interval:  342 QTC Calculation: 452 R Axis:   42  Text  Interpretation: Sinus tachycardia Confirmed by Bethann Berkshire [id number]) on 04/09/2023 9:09:17 PM  Radiology DG Chest Port 1 View Result Date: 04/09/2023 CLINICAL DATA:  Possible sepsis shortness of breath, initial encounter EXAM: PORTABLE CHEST 1 VIEW COMPARISON:  02/12/2023 FINDINGS: Cardiac shadow is enlarged. Lungs are well aerated bilaterally. No focal infiltrate or sizable effusion is seen. No bony abnormality is noted. IMPRESSION: No active disease. Electronically Signed   By: Alcide Clever  M.D.   On: 04/09/2023 20:09    Procedures Procedures    Medications Ordered in ED Medications  acetaminophen (TYLENOL) tablet 650 mg (650 mg Oral Given 04/09/23 1855)  cefTRIAXone (ROCEPHIN) 2 g in sodium chloride 0.9 % 100 mL IVPB (0 g Intravenous Stopped 04/09/23 2008)  azithromycin (ZITHROMAX) 500 mg in sodium chloride 0.9 % 250 mL IVPB (0 mg Intravenous Stopped 04/09/23 2040)  sodium chloride 0.9 % bolus 2,000 mL (2,000 mLs Intravenous New Bag/Given 04/09/23 1918)  methylPREDNISolone sodium succinate (SOLU-MEDROL) 125 mg/2 mL injection 125 mg (125 mg Intravenous Given 04/09/23 1934)  ipratropium-albuterol (DUONEB) 0.5-2.5 (3) MG/3ML nebulizer solution 3 mL (3 mLs Nebulization Given 04/09/23 1941)  albuterol (PROVENTIL) (2.5 MG/3ML) 0.083% nebulizer solution 2.5 mg (2.5 mg Nebulization Given 04/09/23 1941)  magnesium sulfate IVPB 2 g 50 mL (0 g Intravenous Stopped 04/09/23 2050)  ondansetron (ZOFRAN) injection 4 mg (4 mg Intravenous Given 04/09/23 1935)    ED Course/ Medical Decision Making/ A&P                                 Medical Decision Making Amount and/or Complexity of Data Reviewed Labs: ordered. Radiology: ordered.  Risk OTC drugs. Prescription drug management. Decision regarding hospitalization.  This patient presents to the ED for concern of shortness of breath and cough, this involves an extensive number of treatment options, and is a complaint that carries with it a high risk of  complications and morbidity.  The differential diagnosis includes pneumonia, COPD exacerbation   Co morbidities that complicate the patient evaluation  COPD   Additional history obtained:  Additional history obtained from patient External records from outside source obtained and reviewed including all records   Lab Tests:  I Ordered, and personally interpreted labs.  The pertinent results include: Glucose 248   Imaging Studies ordered:  I ordered imaging studies including chest x-ray I independently visualized and interpreted imaging which showed no active disease I agree with the radiologist interpretation   Cardiac Monitoring: / EKG:  The patient was maintained on a cardiac monitor.  I personally viewed and interpreted the cardiac monitored which showed an underlying rhythm of: Normal sinus rhythm   Consultations Obtained:  I requested consultation with the hospitalist,  and discussed lab and imaging findings as well as pertinent plan - they recommend: Admit for COPD exacerbation and hypoxia   Problem List / ED Course / Critical interventions / Medication management  COPD exacerbation I ordered medication including albuterol and Atrovent Reevaluation of the patient after these medicines showed that the patient improved I have reviewed the patients home medicines and have made adjustments as needed   Social Determinants of Health:  None   Test / Admission - Considered:  None  Patient with hypoxia secondary to COPD exacerbation and influenza.  He will be admitted to medicine        Final Clinical Impression(s) / ED Diagnoses Final diagnoses:  Influenza A  Hypoxia    Rx / DC Orders ED Discharge Orders     None         Bethann Berkshire, MD 04/12/23 [phone]

## 2023-04-09 NOTE — H&P (Addendum)
[place]   PATIENT NAME: David Crosby    MR#:  [id number]  DATE OF BIRTH:  10-04-1965  DATE OF ADMISSION:  04/09/2023  PRIMARY CARE PHYSICIAN: Rebekah Chesterfield, NP   Patient is coming from: Home  REQUESTING/REFERRING PHYSICIAN: Bethann Berkshire, MD  CHIEF COMPLAINT:   Chief Complaint  Patient presents with   Shortness of Breath    HISTORY OF PRESENT ILLNESS:  David Crosby is a 58 y.o. male with medical history significant for asthma, GERD, hypertension, dyslipidemia, hypothyroidism, and type 2 diabetes mellitus, who presented to the emergency room with acute onset of worsening dyspnea with associated dry cough over the last couple of days as well as wheezing with low-grade tactile fever without chills.  He admitted to nausea without vomiting or diarrhea or abdominal pain.  No chest pain except with excessive cough and no r palpitations.  No dysuria, oliguria or hematuria or flank pain.  He has no history of tobacco use.  ED Course: When he came to the ER, BP was 152/81 with heart rate of 104 and respiratory to 24, temperature 102 and possibly was 94% on 5 L O2 by nasal cannula and later 4 L.  Labs revealed CMP was remarkable for calcium of 8.8 and AST of 70 and ALT of 63.  Lactic acid was 2.9 and later 2.8.  CBC was within normal.  Influenza A was positive and influenza B was negative as well as RSV and COVID-19 PCR's.  Blood cultures were drawn. EKG as reviewed by me : EKG showed sinus tachycardia with a rate of 105. Imaging: Portable chest x-ray showed no acute cardiopulmonary disease.  The patient was given 650 mg p.o. Tylenol, nebulizer butyryl 2.5 mg, IV Rocephin and Zithromax, DuoNebs, 2 g of IV magnesium sulfate, 125 mg of IV Solu-Medrol, 4 mg of IV Zofran and 2 L bolus of IV normal saline.  He will be admitted to a medical telemetry bed for further evaluation and management. PAST MEDICAL HISTORY:   Past Medical History:  Diagnosis Date   Asthma     Cardiomegaly    Seen on chest x-ray   GERD (gastroesophageal reflux disease)    Gout    Hyperlipidemia    Hypertension    Hypothyroidism    Motion sickness    Nephritis    Palpitation    Pneumonia    hx of pneumonia several times    PONV (postoperative nausea and vomiting)    Renal insufficiency    Stage II kidney disease per [facility] OV note of 04/02/2015     PAST SURGICAL HISTORY:   Past Surgical History:  Procedure Laterality Date   CYSTOSCOPY WITH RETROGRADE PYELOGRAM, URETEROSCOPY AND STENT PLACEMENT Left 05/23/2015   Procedure: CYSTOSCOPY, LEFT RETROGRADE PYELOGRAM, LEFT URETEROSCOPY,  BASKET EXTRACTION AND DOUBLE J STENT PLACEMENT LEFT URETERAL DILATION;  Surgeon: Crist Fat, MD;  Location: [facility];  Service: Urology;  Laterality: Left;   HOLMIUM LASER APPLICATION Left 05/23/2015   Procedure: HOLMIUM LASER LITHOTRIPSY ;  Surgeon: Crist Fat, MD;  Location: [facility];  Service: Urology;  Laterality: Left;   left foot surgery     Multiple   left forehead surgery      due to broken sinus cavity    LITHOTRIPSY Left 05/07/2015    SOCIAL HISTORY:   Social History   Tobacco Use   Smoking status: Never   Smokeless tobacco: Never  Substance Use Topics   Alcohol use: No  Alcohol/week: 0.0 standard drinks of alcohol    FAMILY HISTORY:   Family History  Problem Relation Age of Onset   Dementia Mother    Heart disease Father 73   Heart attack Other        Paternal and Maternal grandmother   Hypertension Other        Several family member    DRUG ALLERGIES:  Not on File  REVIEW OF SYSTEMS:   ROS As per history of present illness. All pertinent systems were reviewed above. Constitutional, HEENT, cardiovascular, respiratory, GI, GU, musculoskeletal, neuro, psychiatric, endocrine, integumentary and hematologic systems were reviewed and are otherwise negative/unremarkable except for positive findings mentioned above in the HPI.   MEDICATIONS AT  HOME:   Prior to Admission medications   Medication Sig Start Date End Date Taking? Authorizing Provider  acebutolol (SECTRAL) 200 MG capsule TAKE 2 CAPSULES BY MOUTH IN THE MORNING AND 1 IN THE EVENING MUST  HAVE  OFFICE  VISIT  FOR  ADDITIONAL  REFILLS 02/05/17   Antoine Poche, MD  albuterol (VENTOLIN HFA) 108 (90 Base) MCG/ACT inhaler Inhale 1 puff into the lungs every 6 (six) hours as needed for wheezing or shortness of breath. 02/14/23   Regalado, Belkys A, MD  amLODipine (NORVASC) 5 MG tablet Take 1.5 tablets (7.5 mg total) by mouth daily. 04/08/22   Carlos Levering, NP  aspirin 81 MG tablet Take 81 mg by mouth daily. Reported on 06/11/2015    [provider]  dextromethorphan-guaiFENesin (MUCINEX DM) 30-600 MG 12hr tablet Take 1 tablet by mouth 2 (two) times daily. 02/14/23   Regalado, Belkys A, MD  febuxostat (ULORIC) 40 MG tablet Take 80 mg by mouth daily.     [provider]  fluticasone furoate-vilanterol (BREO ELLIPTA) 200-25 MCG/INH AEPB Inhale 1 puff into the lungs daily. 02/14/23   Regalado, Belkys A, MD  levothyroxine (SYNTHROID, LEVOTHROID) 100 MCG tablet Take 100 mcg by mouth daily before breakfast.    [provider]  metFORMIN (GLUCOPHAGE) 500 MG tablet Take 1 tablet (500 mg total) by mouth 2 (two) times daily with a meal. 02/14/23   Regalado, Belkys A, MD  pantoprazole (PROTONIX) 40 MG tablet Take 1 tablet (40 mg total) by mouth 2 (two) times daily. 02/14/23   Regalado, Belkys A, MD  rosuvastatin (CRESTOR) 10 MG tablet Take 1 tablet (10 mg total) by mouth daily. 08/26/22   Rollene Rotunda, MD  tamsulosin (FLOMAX) 0.4 MG CAPS capsule Take 1 capsule (0.4 mg total) by mouth daily after supper. 02/14/23   Regalado, Belkys A, MD  traMADol (ULTRAM) 50 MG tablet Take 1-2 tablets (50-100 mg total) by mouth every 6 (six) hours as needed for moderate pain. 05/23/15   Crist Fat, MD      VITAL SIGNS:  Blood pressure (!) 136/50, pulse 80, temperature  98.7 F (37.1 C), temperature source Oral, resp. rate 19, height 5\' 11"  (1.803 m), weight 131.5 kg, SpO2 92%.  PHYSICAL EXAMINATION:  Physical Exam  GENERAL:  57 y.o.-year-old patient lying in the bed with mild respiratory distress with conversational dyspnea. EYES: Pupils equal, round, reactive to light and accommodation. No scleral icterus. Extraocular muscles intact.  HEENT: Head atraumatic, normocephalic. Oropharynx and nasopharynx clear.  NECK:  Supple, no jugular venous distention. No thyroid enlargement, no tenderness.  LUNGS: Diffuse expiratory wheezes with tight expiratory airflow and harsh vesicular breathing.  No use of accessory muscles of respiration.  CARDIOVASCULAR: Regular rate and rhythm, S1, S2 normal. No murmurs, rubs,  or gallops.  ABDOMEN: Soft, nondistended, nontender. Bowel sounds present. No organomegaly or mass.  EXTREMITIES: No pedal edema, cyanosis, or clubbing.  NEUROLOGIC: Cranial nerves II through XII are intact. Muscle strength 5/5 in all extremities. Sensation intact. Gait not checked.  PSYCHIATRIC: The patient is alert and oriented x 3.  Normal affect and good eye contact. SKIN: No obvious rash, lesion, or ulcer.   LABORATORY PANEL:   CBC Recent Labs  Lab 04/09/23 1915  WBC 10.1  HGB 14.8  HCT 44.0  PLT 214   ------------------------------------------------------------------------------------------------------------------  Chemistries  Recent Labs  Lab 04/09/23 1915  NA 136  K 4.3  CL 98  CO2 25  GLUCOSE 144*  BUN 14  CREATININE 1.08  CALCIUM 8.8*  AST 70*  ALT 63*  ALKPHOS 50  BILITOT 1.0   ------------------------------------------------------------------------------------------------------------------  Cardiac Enzymes No results for input(s): "TROPONINI" in the last 168 hours. ------------------------------------------------------------------------------------------------------------------  RADIOLOGY:  DG Chest Port 1  View Result Date: 04/09/2023 CLINICAL DATA:  Possible sepsis shortness of breath, initial encounter EXAM: PORTABLE CHEST 1 VIEW COMPARISON:  02/12/2023 FINDINGS: Cardiac shadow is enlarged. Lungs are well aerated bilaterally. No focal infiltrate or sizable effusion is seen. No bony abnormality is noted. IMPRESSION: No active disease. Electronically Signed   By: Alcide Clever M.D.   On: 04/09/2023 20:09      IMPRESSION AND PLAN:  Assessment and Plan: * COPD exacerbation (HCC) - The patient will be admitted to a medically monitored bed. - We will place the patient IV steroid therapy with IV Solu-Medrol as well as nebulized bronchodilator therapy with duonebs q.i.d. and q.4 hours p.r.n.Marland Kitchen - Mucolytic therapy will be provided with Mucinex and antibiotic therapy with IV Rocephin . - O2 protocol will be followed. - We will hold off long-acting beta agonist.   Acute respiratory failure with hypoxia (HCC) - This is clearly secondary to #1. - O2 protocol will be followed. - Management otherwise as above.  Influenza A - We will place him on Tamiflu.  Essential hypertension - We will continue antihypertensive therapy.  GERD without esophagitis - We will continue PPI therapy.  Type 2 diabetes mellitus without complications (HCC) - We will place him on supplemental coverage with NovoLog.  Dyslipidemia - We will continue statin therapy.  BPH (benign prostatic hyperplasia) - We will continue Flomax.  Hypothyroidism - We will continue Synthroid.       DVT prophylaxis: Lovenox.  Advanced Care Planning:  Code Status: full code.  Family Communication:  The plan of care was discussed in details with the patient (and family). I answered all questions. The patient agreed to proceed with the above mentioned plan. Further management will depend upon hospital course. Disposition Plan: Back to previous home environment Consults called: none.  All the records are reviewed and case discussed  with ED provider.  Status is: Inpatient    At the time of the admission, it appears that the appropriate admission status for this patient is inpatient.  This is judged to be reasonable and necessary in order to provide the required intensity of service to ensure the patient's safety given the presenting symptoms, physical exam findings and initial radiographic and laboratory data in the context of comorbid conditions.  The patient requires inpatient status due to high intensity of service, high risk of further deterioration and high frequency of surveillance required.  I certify that at the time of admission, it is my clinical judgment that the patient will require inpatient hospital care extending more than  2 midnights.                            Dispo: The patient is from: Home              Anticipated d/c is to: Home              Patient currently is not medically stable to d/c.              Difficult to place patient: No  Hannah Beat M.D on 04/10/2023 at 3:35 AM  [facility]   From 7 PM-7 AM, contact night-coverage [email or web address]  CC: Primary care physician; Rebekah Chesterfield, NP

## 2023-04-10 DIAGNOSIS — E119 Type 2 diabetes mellitus without complications: Secondary | ICD-10-CM

## 2023-04-10 DIAGNOSIS — J441 Chronic obstructive pulmonary disease with (acute) exacerbation: Secondary | ICD-10-CM | POA: Diagnosis not present

## 2023-04-10 DIAGNOSIS — E785 Hyperlipidemia, unspecified: Secondary | ICD-10-CM | POA: Insufficient documentation

## 2023-04-10 DIAGNOSIS — K219 Gastro-esophageal reflux disease without esophagitis: Secondary | ICD-10-CM | POA: Insufficient documentation

## 2023-04-10 DIAGNOSIS — J101 Influenza due to other identified influenza virus with other respiratory manifestations: Principal | ICD-10-CM

## 2023-04-10 LAB — GLUCOSE, CAPILLARY
Glucose-Capillary: 177 mg/dL — ABNORMAL HIGH (ref 70–99)
Glucose-Capillary: 200 mg/dL — ABNORMAL HIGH (ref 70–99)
Glucose-Capillary: 216 mg/dL — ABNORMAL HIGH (ref 70–99)

## 2023-04-10 LAB — CBC
HCT: 39.6 % (ref 39.0–52.0)
Hemoglobin: 13.5 g/dL (ref 13.0–17.0)
MCH: 31.8 pg (ref 26.0–34.0)
MCHC: 34.1 g/dL (ref 30.0–36.0)
MCV: 93.4 fL (ref 80.0–100.0)
Platelets: 216 10*3/uL (ref 150–400)
RBC: 4.24 MIL/uL (ref 4.22–5.81)
RDW: 13.1 % (ref 11.5–15.5)
WBC: 9 10*3/uL (ref 4.0–10.5)
nRBC: 0 % (ref 0.0–0.2)

## 2023-04-10 LAB — BASIC METABOLIC PANEL
Anion gap: 12 (ref 5–15)
BUN: 18 mg/dL (ref 6–20)
CO2: 23 mmol/L (ref 22–32)
Calcium: 8.3 mg/dL — ABNORMAL LOW (ref 8.9–10.3)
Chloride: 101 mmol/L (ref 98–111)
Creatinine, Ser: 1.09 mg/dL (ref 0.61–1.24)
GFR, Estimated: >60 mL/min (ref >60)
Glucose, Bld: 248 mg/dL — ABNORMAL HIGH (ref 70–99)
Potassium: 4.5 mmol/L (ref 3.5–5.1)
Sodium: 136 mmol/L (ref 135–145)

## 2023-04-10 LAB — CBG MONITORING, ED: Glucose-Capillary: 202 mg/dL — ABNORMAL HIGH (ref 70–99)

## 2023-04-10 MED ORDER — ENSURE ENLIVE PO LIQD
237.0000 mL | Freq: Two times a day (BID) | ORAL | Status: DC
  Administered 2023-04-10: 237 mL via ORAL

## 2023-04-10 MED ORDER — BENZONATATE 100 MG PO CAPS
100.0000 mg | ORAL_CAPSULE | Freq: Three times a day (TID) | ORAL | Status: DC
  Administered 2023-04-10 (×3): 100 mg via ORAL
  Filled 2023-04-10 (×4): qty 1

## 2023-04-10 NOTE — Assessment & Plan Note (Signed)
-   We will continue antihypertensive therapy.

## 2023-04-10 NOTE — Progress Notes (Signed)
   04/10/23 0840  TOC Brief Assessment  Insurance and Status Reviewed  Patient has primary care physician Yes  Home environment has been reviewed from home  Prior level of function: independent  Prior/Current Home Services No current home services  Social Drivers of Health Review SDOH reviewed no interventions necessary  Readmission risk has been reviewed Yes  Transition of care needs no transition of care needs at this time     Transition of Care Department [facility]) has reviewed patient and no TOC needs have been identified at this time. We will continue to monitor patient advancement through interdisciplinary progression rounds. If new patient transition needs arise, please place a TOC consult.

## 2023-04-10 NOTE — Assessment & Plan Note (Signed)
-   We will place him on supplemental coverage with NovoLog
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
## 2023-04-10 NOTE — Assessment & Plan Note (Addendum)
-   The patient will be admitted to a medically monitored bed. - We will place the patient IV steroid therapy with IV Solu-Medrol as well as nebulized bronchodilator therapy with duonebs q.i.d. and q.4 hours p.r.n.Marland Kitchen - Mucolytic therapy will be provided with Mucinex and antibiotic therapy with IV Rocephin. - O2 protocol will be followed. - We will hold off long-acting beta agonist.

## 2023-04-10 NOTE — ED Notes (Signed)
Report given and accepted by 300 nurse

## 2023-04-10 NOTE — Assessment & Plan Note (Signed)
 -  We will continue Flomax

## 2023-04-10 NOTE — Assessment & Plan Note (Signed)
 -  We will continue Synthroid.

## 2023-04-10 NOTE — Assessment & Plan Note (Signed)
 -  We will continue statin therapy.

## 2023-04-10 NOTE — Progress Notes (Signed)
Patient removed from O2. O2 sat registering at 89 on room air. O2 placed at 2L.

## 2023-04-10 NOTE — Progress Notes (Signed)
PROGRESS NOTE    WIN GUAJARDO  ZOX:[id number] DOB: 11-22-1965 DOA: 04/09/2023 PCP: Rebekah Chesterfield, NP    Brief Narrative:  David Crosby is a 58 y.o. male with medical history significant for asthma, GERD, hypertension, dyslipidemia, hypothyroidism, and type 2 diabetes mellitus, who presented to the emergency room with acute onset of worsening dyspnea with associated dry cough over the last couple of days as well as wheezing with low-grade tactile fever without chills.  He admitted to nausea without vomiting or diarrhea or abdominal pain.  Found to have COPD exacerbation from Flu A.    Assessment and Plan: * COPD exacerbation (HCC) - IV steroid therapy with IV Solu-Medrol  -nebs - Mucolytic therapy will be provided with Mucinex and antibiotic therapy with IV Rocephin starter by admitter. - wean O2 as able -home O2 eval in AM  Acute respiratory failure with hypoxia (HCC) - not on O2 at home -wean to RA as able -home O2 study  Influenza A - Tamiflu.  Essential hypertension - continue antihypertensive therapy.  GERD without esophagitis -  continue PPI therapy.  Type 2 diabetes mellitus without complications (HCC) - SSI  Dyslipidemia - continue statin therapy (will need monitoring of mildly elevated LFTs)  BPH (benign prostatic hyperplasia) - continue Flomax.  Hypothyroidism - continue Synthroid.  Obesity Estimated body mass index is 40.45 kg/m as calculated from the following:   Height as of this encounter: 5\' 11"  (1.803 m).   Weight as of this encounter: 131.5 kg.       DVT prophylaxis:     Code Status: Full Code   Disposition Plan:  Level of care: Telemetry Status is: Inpatient Remains inpatient appropriate     Consultants:  none   Subjective: Overall feeling better than yesterday but still on O2  Objective: Vitals:   04/10/23 0615 04/10/23 0700 04/10/23 0746 04/10/23 0900  BP:  128/68  126/73  Pulse: 94 82  74  Resp: 20 (!) 22   18  Temp:   98.5 F (36.9 C)   TempSrc:   Oral   SpO2: 93% 90%  90%  Weight:      Height:        Intake/Output Summary (Last 24 hours) at 04/10/2023 0954 Last data filed at 04/09/2023 2152 Gross per 24 hour  Intake 2400 ml  Output --  Net 2400 ml   Filed Weights   04/09/23 1839  Weight: 131.5 kg    Examination:   General: Appearance:    Severely obese male in no acute distress     Lungs:     Diminished, no wheezing, on 3L O2, respirations unlabored  Heart:    Normal heart rate.     MS:   All extremities are intact.    Neurologic:   Awake, alert       Data Reviewed: I have personally reviewed following labs and imaging studies  CBC: Recent Labs  Lab 04/09/23 1915 04/10/23 0332  WBC 10.1 9.0  NEUTROABS 8.7*  --   HGB 14.8 13.5  HCT 44.0 39.6  MCV 92.2 93.4  PLT 214 216   Basic Metabolic Panel: Recent Labs  Lab 04/09/23 1915 04/10/23 0332  NA 136 136  K 4.3 4.5  CL 98 101  CO2 25 23  GLUCOSE 144* 248*  BUN 14 18  CREATININE 1.08 1.09  CALCIUM 8.8* 8.3*   GFR: Estimated Creatinine Clearance: 103.4 mL/min (by C-G formula based on SCr of 1.09 mg/dL). Liver Function Tests: Recent  Labs  Lab 04/09/23 1915  AST 70*  ALT 63*  ALKPHOS 50  BILITOT 1.0  PROT 7.6  ALBUMIN 4.0   No results for input(s): "LIPASE", "AMYLASE" in the last 168 hours. No results for input(s): "AMMONIA" in the last 168 hours. Coagulation Profile: Recent Labs  Lab 04/09/23 1915  INR 1.0   Cardiac Enzymes: No results for input(s): "CKTOTAL", "CKMB", "CKMBINDEX", "TROPONINI" in the last 168 hours. BNP (last 3 results) No results for input(s): "PROBNP" in the last 8760 hours. HbA1C: No results for input(s): "HGBA1C" in the last 72 hours. CBG: Recent Labs  Lab 04/09/23 2240 04/10/23 0744  GLUCAP 229* 202*   Lipid Profile: No results for input(s): "CHOL", "HDL", "LDLCALC", "TRIG", "CHOLHDL", "LDLDIRECT" in the last 72 hours. Thyroid Function Tests: No results for  input(s): "TSH", "T4TOTAL", "FREET4", "T3FREE", "THYROIDAB" in the last 72 hours. Anemia Panel: No results for input(s): "VITAMINB12", "FOLATE", "FERRITIN", "TIBC", "IRON", "RETICCTPCT" in the last 72 hours. Sepsis Labs: Recent Labs  Lab 04/09/23 1915 04/09/23 2136  LATICACIDVEN 2.9* 2.8*    Recent Results (from the past 240 hours)  Resp panel by RT-PCR (RSV, Flu A&B, Covid) Anterior Nasal Swab     Status: Abnormal   Collection Time: 04/09/23  6:40 PM   Specimen: Anterior Nasal Swab  Result Value Ref Range Status   SARS Coronavirus 2 by RT PCR NEGATIVE NEGATIVE Final    Comment: (NOTE) SARS-CoV-2 target nucleic acids are NOT DETECTED.  The SARS-CoV-2 RNA is generally detectable in upper respiratory specimens during the acute phase of infection. The lowest concentration of SARS-CoV-2 viral copies this assay can detect is 138 copies/mL. A negative result does not preclude SARS-Cov-2 infection and should not be used as the sole basis for treatment or other patient management decisions. A negative result may occur with  improper specimen collection/handling, submission of specimen other than nasopharyngeal swab, presence of viral mutation(s) within the areas targeted by this assay, and inadequate number of viral copies(<138 copies/mL). A negative result must be combined with clinical observations, patient history, and epidemiological information. The expected result is Negative.  Fact Sheet for Patients:  BloggerCourse.com  Fact Sheet for Healthcare Providers:  SeriousBroker.it  This test is no t yet approved or cleared by the [place] FDA and  has been authorized for detection and/or diagnosis of SARS-CoV-2 by FDA under an Emergency Use Authorization (EUA). This EUA will remain  in effect (meaning this test can be used) for the duration of the COVID-19 declaration under Section 564(b)(1) of the Act, 21 U.S.C.section  360bbb-3(b)(1), unless the authorization is terminated  or revoked sooner.       Influenza A by PCR POSITIVE (A) NEGATIVE Final   Influenza B by PCR NEGATIVE NEGATIVE Final    Comment: (NOTE) The Xpert Xpress SARS-CoV-2/FLU/RSV plus assay is intended as an aid in the diagnosis of influenza from Nasopharyngeal swab specimens and should not be used as a sole basis for treatment. Nasal washings and aspirates are unacceptable for Xpert Xpress SARS-CoV-2/FLU/RSV testing.  Fact Sheet for Patients: BloggerCourse.com  Fact Sheet for Healthcare Providers: SeriousBroker.it  This test is not yet approved or cleared by the [place] FDA and has been authorized for detection and/or diagnosis of SARS-CoV-2 by FDA under an Emergency Use Authorization (EUA). This EUA will remain in effect (meaning this test can be used) for the duration of the COVID-19 declaration under Section 564(b)(1) of the Act, 21 U.S.C. section 360bbb-3(b)(1), unless the authorization is terminated or revoked.  Resp Syncytial Virus by PCR NEGATIVE NEGATIVE Final    Comment: (NOTE) Fact Sheet for Patients: BloggerCourse.com  Fact Sheet for Healthcare Providers: SeriousBroker.it  This test is not yet approved or cleared by the [place] FDA and has been authorized for detection and/or diagnosis of SARS-CoV-2 by FDA under an Emergency Use Authorization (EUA). This EUA will remain in effect (meaning this test can be used) for the duration of the COVID-19 declaration under Section 564(b)(1) of the Act, 21 U.S.C. section 360bbb-3(b)(1), unless the authorization is terminated or revoked.  Performed at [facility], [address] [place], [place] [address]   Blood Culture (routine x 2)     Status: None (Preliminary result)   Collection Time: 04/09/23  7:15 PM   Specimen: BLOOD RIGHT FOREARM  Result Value Ref  Range Status   Specimen Description   Final    BLOOD RIGHT FOREARM BOTTLES DRAWN AEROBIC AND ANAEROBIC   Special Requests Blood Culture adequate volume  Final   Culture   Final    NO GROWTH < 12 HOURS Performed at [facility], [address] [place], [place] [address]    Report Status PENDING  Incomplete  Blood Culture (routine x 2)     Status: None (Preliminary result)   Collection Time: 04/09/23  7:15 PM   Specimen: BLOOD RIGHT FOREARM  Result Value Ref Range Status   Specimen Description   Final    BLOOD RIGHT FOREARM BOTTLES DRAWN AEROBIC AND ANAEROBIC   Special Requests Blood Culture adequate volume  Final   Culture   Final    NO GROWTH < 12 HOURS Performed at [facility], [address], [place], [place] [address]    Report Status PENDING  Incomplete         Radiology Studies: DG Chest Port 1 View Result Date: 04/09/2023 CLINICAL DATA:  Possible sepsis shortness of breath, initial encounter EXAM: PORTABLE CHEST 1 VIEW COMPARISON:  02/12/2023 FINDINGS: Cardiac shadow is enlarged. Lungs are well aerated bilaterally. No focal infiltrate or sizable effusion is seen. No bony abnormality is noted. IMPRESSION: No active disease. Electronically Signed   By: Alcide Clever M.D.   On: 04/09/2023 20:09        Scheduled Meds:  acebutolol  200 mg Oral BID   amLODipine  7.5 mg Oral Daily   aspirin EC  81 mg Oral Daily   enoxaparin (LOVENOX) injection  0.5 mg/kg Subcutaneous Q24H   febuxostat  80 mg Oral Daily   guaiFENesin  600 mg Oral BID   insulin aspart  0-20 Units Subcutaneous TID WC   ipratropium-albuterol  3 mL Nebulization QID   levothyroxine  112 mcg Oral QAC breakfast   methylPREDNISolone (SOLU-MEDROL) injection  40 mg Intravenous Q12H   Followed by   Melene Muller ON 04/11/2023] predniSONE  40 mg Oral Q breakfast   oseltamivir  75 mg Oral BID   pantoprazole  40 mg Oral BID   rosuvastatin  10 mg Oral Daily   tamsulosin  0.4 mg Oral PC supper   Continuous Infusions:   sodium chloride 40 mL/hr at 04/10/23 7425   cefTRIAXone (ROCEPHIN)  IV       LOS: 1 day    Time spent: 45 minutes spent on chart review, discussion with nursing staff, consultants, updating family and interview/physical exam; more than 50% of that time was spent in counseling and/or coordination of care.    Joseph Art, DO [facility] Available via Epic secure chat 7am-7pm After these hours,  please refer to coverage provider listed on [email or web address] 04/10/2023, 9:54 AM

## 2023-04-10 NOTE — ED Notes (Signed)
Acbutolol delayed. Pharmacy notified of need.

## 2023-04-10 NOTE — Assessment & Plan Note (Addendum)
-   We will place him on Tamiflu.

## 2023-04-10 NOTE — Assessment & Plan Note (Signed)
 -  We will continue PPI therapy

## 2023-04-10 NOTE — Assessment & Plan Note (Signed)
-   This is clearly secondary to #1. - O2 protocol will be followed. - Management otherwise as above. 

## 2023-04-11 ENCOUNTER — Inpatient Hospital Stay (HOSPITAL_COMMUNITY): Payer: Medicare Other

## 2023-04-11 DIAGNOSIS — J441 Chronic obstructive pulmonary disease with (acute) exacerbation: Secondary | ICD-10-CM | POA: Diagnosis not present

## 2023-04-11 LAB — GLUCOSE, CAPILLARY
Glucose-Capillary: 154 mg/dL — ABNORMAL HIGH (ref 70–99)
Glucose-Capillary: 171 mg/dL — ABNORMAL HIGH (ref 70–99)

## 2023-04-11 LAB — CBC
HCT: 39.8 % (ref 39.0–52.0)
Hemoglobin: 12.8 g/dL — ABNORMAL LOW (ref 13.0–17.0)
MCH: 31 pg (ref 26.0–34.0)
MCHC: 32.2 g/dL (ref 30.0–36.0)
MCV: 96.4 fL (ref 80.0–100.0)
Platelets: 220 10*3/uL (ref 150–400)
RBC: 4.13 MIL/uL — ABNORMAL LOW (ref 4.22–5.81)
RDW: 13.1 % (ref 11.5–15.5)
WBC: 16 10*3/uL — ABNORMAL HIGH (ref 4.0–10.5)
nRBC: 0 % (ref 0.0–0.2)

## 2023-04-11 LAB — BASIC METABOLIC PANEL
Anion gap: 9 (ref 5–15)
BUN: 27 mg/dL — ABNORMAL HIGH (ref 6–20)
CO2: 27 mmol/L (ref 22–32)
Calcium: 8.4 mg/dL — ABNORMAL LOW (ref 8.9–10.3)
Chloride: 99 mmol/L (ref 98–111)
Creatinine, Ser: 1.08 mg/dL (ref 0.61–1.24)
GFR, Estimated: >60 mL/min (ref >60)
Glucose, Bld: 159 mg/dL — ABNORMAL HIGH (ref 70–99)
Potassium: 4.4 mmol/L (ref 3.5–5.1)
Sodium: 135 mmol/L (ref 135–145)

## 2023-04-11 MED ORDER — ATORVASTATIN CALCIUM 20 MG PO TABS: 20.0000 mg | ORAL_TABLET | Freq: Every day | ORAL | 5 refills | Status: AC

## 2023-04-11 MED ORDER — LEVOTHYROXINE SODIUM 112 MCG PO TABS: 112.0000 ug | ORAL_TABLET | Freq: Every day | ORAL | 5 refills | Status: AC

## 2023-04-11 MED ORDER — ALBUTEROL SULFATE HFA 108 (90 BASE) MCG/ACT IN AERS: 2.0000 | INHALATION_SPRAY | RESPIRATORY_TRACT | 2 refills | Status: AC | PRN

## 2023-04-11 MED ORDER — ALBUTEROL SULFATE (2.5 MG/3ML) 0.083% IN NEBU: 2.5000 mg | INHALATION_SOLUTION | RESPIRATORY_TRACT | Status: DC | PRN

## 2023-04-11 MED ORDER — PREDNISONE 20 MG PO TABS: 40.0000 mg | ORAL_TABLET | Freq: Every day | ORAL | 0 refills | Status: AC

## 2023-04-11 MED ORDER — PANTOPRAZOLE SODIUM 40 MG PO TBEC: 40.0000 mg | DELAYED_RELEASE_TABLET | Freq: Every day | ORAL | 5 refills | Status: AC

## 2023-04-11 MED ORDER — FLUTICASONE FUROATE-VILANTEROL 200-25 MCG/INH IN AEPB: 1.0000 | INHALATION_SPRAY | Freq: Every day | RESPIRATORY_TRACT | 5 refills | Status: AC

## 2023-04-11 MED ORDER — METHYLPREDNISOLONE SODIUM SUCC 40 MG IJ SOLR
40.0000 mg | Freq: Two times a day (BID) | INTRAMUSCULAR | Status: DC
  Administered 2023-04-11: 40 mg via INTRAVENOUS
  Filled 2023-04-11: qty 1

## 2023-04-11 MED ORDER — DOXYCYCLINE HYCLATE 100 MG PO TABS: 100.0000 mg | ORAL_TABLET | Freq: Two times a day (BID) | ORAL | 0 refills | Status: AC

## 2023-04-11 MED ORDER — ASPIRIN 81 MG PO TABS: 81.0000 mg | ORAL_TABLET | Freq: Every day | ORAL | 5 refills | Status: AC

## 2023-04-11 MED ORDER — OSELTAMIVIR PHOSPHATE 75 MG PO CAPS: 75.0000 mg | ORAL_CAPSULE | Freq: Two times a day (BID) | ORAL | 0 refills | Status: AC

## 2023-04-11 MED ORDER — ACETAMINOPHEN 325 MG PO TABS: 650.0000 mg | ORAL_TABLET | Freq: Four times a day (QID) | ORAL | Status: AC | PRN

## 2023-04-11 MED ORDER — AMLODIPINE BESYLATE 10 MG PO TABS: 10.0000 mg | ORAL_TABLET | Freq: Every day | ORAL | 5 refills | Status: AC

## 2023-04-11 MED ORDER — METFORMIN HCL 500 MG PO TABS: 500.0000 mg | ORAL_TABLET | Freq: Two times a day (BID) | ORAL | 5 refills | Status: AC

## 2023-04-11 MED ORDER — DM-GUAIFENESIN ER 30-600 MG PO TB12
1.0000 | ORAL_TABLET | Freq: Two times a day (BID) | ORAL | Status: DC
  Administered 2023-04-11: 1 via ORAL
  Filled 2023-04-11: qty 1

## 2023-04-11 MED ORDER — LISINOPRIL 20 MG PO TABS: 20.0000 mg | ORAL_TABLET | Freq: Every day | ORAL | 5 refills | Status: AC

## 2023-04-11 NOTE — Discharge Instructions (Signed)
1)Take Medications as prescribed 2)follow up with Rebekah Chesterfield, NP (Primary Care Physician) in 1 week  3)Morbid Obesity- -Low calorie diet, portion control and increase physical activity discussed with patient -Body mass index is 40.45 kg/m.

## 2023-04-11 NOTE — Progress Notes (Signed)
Patient on 6lnc upon assessment this morning; sats 94%. Meds adjusted by Dr. Marisa Severin. Titrated patient to 5l/[place] with sats maintaining. Patient alerted to let us know if he feels any changes with his breathing. Is alert; oriented and able to show use of call bell.

## 2023-04-11 NOTE — Plan of Care (Signed)
  Problem: Coping: Goal: Ability to adjust to condition or change in health will improve Outcome: Progressing   Problem: Health Behavior/Discharge Planning: Goal: Ability to identify and utilize available resources and services will improve Outcome: Progressing   

## 2023-04-11 NOTE — Plan of Care (Signed)

## 2023-04-11 NOTE — Progress Notes (Signed)
Patient sats 100% on the 5l/[place]. Titrated patient's o2 to 3.5l while in room. Maintaining at 95%. Left patient on o2 monitor for now. Patient's call bell within reach.

## 2023-04-11 NOTE — Progress Notes (Signed)
Went into patient room. Found to be on RA. Sat of 88%. Ambulated patient, sat for short walk between 88-91% on ra. Notified Dr. Marisa Severin.

## 2023-04-11 NOTE — Discharge Summary (Signed)
David Crosby, is a 58 y.o. male  DOB 1965-08-31  MRN [id number].  Admission date:  04/09/2023  Admitting Physician  Hannah Beat, MD  Discharge Date:  04/11/2023   Primary MD  Rebekah Chesterfield, NP  Recommendations for primary care physician for things to follow:  1)Take Medications as prescribed 2)follow up with Rebekah Chesterfield, NP (Primary Care Physician) in 1 week  3)Morbid Obesity- -Low calorie diet, portion control and increase physical activity discussed with patient -Body mass index is 40.45 kg/m.  Admission Diagnosis  Influenza A [J10.1] Hypoxia [R09.02] COPD exacerbation (HCC) [J44.1]   Discharge Diagnosis  Influenza A [J10.1] Hypoxia [R09.02] COPD exacerbation (HCC) [J44.1]    Principal Problem:   COPD exacerbation (HCC) Active Problems:   Acute respiratory failure with hypoxia (HCC)   Influenza A   Essential hypertension   Hypothyroidism   BPH (benign prostatic hyperplasia)   Dyslipidemia   Type 2 diabetes mellitus without complications (HCC)   GERD without esophagitis      Past Medical History:  Diagnosis Date   Asthma    Cardiomegaly    Seen on chest x-ray   GERD (gastroesophageal reflux disease)    Gout    Hyperlipidemia    Hypertension    Hypothyroidism    Motion sickness    Nephritis    Palpitation    Pneumonia    hx of pneumonia several times    PONV (postoperative nausea and vomiting)    Renal insufficiency    Stage II kidney disease per [facility] OV note of 04/02/2015     Past Surgical History:  Procedure Laterality Date   CYSTOSCOPY WITH RETROGRADE PYELOGRAM, URETEROSCOPY AND STENT PLACEMENT Left 05/23/2015   Procedure: CYSTOSCOPY, LEFT RETROGRADE PYELOGRAM, LEFT URETEROSCOPY,  BASKET EXTRACTION AND DOUBLE J STENT PLACEMENT LEFT URETERAL DILATION;  Surgeon: Crist Fat, MD;  Location: [facility];  Service: Urology;  Laterality: Left;   HOLMIUM  LASER APPLICATION Left 05/23/2015   Procedure: HOLMIUM LASER LITHOTRIPSY ;  Surgeon: Crist Fat, MD;  Location: [facility];  Service: Urology;  Laterality: Left;   left foot surgery     Multiple   left forehead surgery      due to broken sinus cavity    LITHOTRIPSY Left 05/07/2015     HPI  from the history and physical done on the day of admission:  David Crosby is a 58 y.o. male with medical history significant for asthma, GERD, hypertension, dyslipidemia, hypothyroidism, and type 2 diabetes mellitus, who presented to the emergency room with acute onset of worsening dyspnea with associated dry cough over the last couple of days as well as wheezing with low-grade tactile fever without chills.  He admitted to nausea without vomiting or diarrhea or abdominal pain.  No chest pain except with excessive cough and no r palpitations.  No dysuria, oliguria or hematuria or flank pain.  He has no history of tobacco use.   ED Course: When he came to the ER, BP was 152/81 with heart  rate of 104 and respiratory to 24, temperature 102 and possibly was 94% on 5 L O2 by nasal cannula and later 4 L.  Labs revealed CMP was remarkable for calcium of 8.8 and AST of 70 and ALT of 63.  Lactic acid was 2.9 and later 2.8.  CBC was within normal.  Influenza A was positive and influenza B was negative as well as RSV and COVID-19 PCR's.  Blood cultures were drawn. EKG as reviewed by me : EKG showed sinus tachycardia with a rate of 105. Imaging: Portable chest x-ray showed no acute cardiopulmonary disease.   The patient was given 650 mg p.o. Tylenol, nebulizer butyryl 2.5 mg, IV Rocephin and Zithromax, DuoNebs, 2 g of IV magnesium sulfate, 125 mg of IV Solu-Medrol, 4 mg of IV Zofran and 2 L bolus of IV normal saline.  He will be admitted to a medical telemetry bed for further evaluation and management.   Hospital Course:   Brief Narrative:  David Crosby is a 58 y.o. male with medical history significant for  asthma, GERD, hypertension, dyslipidemia, hypothyroidism, and type 2 diabetes mellitus, who presented to the emergency room with acute onset of worsening dyspnea with associated dry cough over the last couple of days as well as wheezing with low-grade tactile fever without chills.  He admitted to nausea without vomiting or diarrhea or abdominal pain.  Found to have COPD exacerbation from Flu A.  Assessment and Plan: 1)COPD exacerbation due to influenza A Overall much improved after treatment with IV Solu-Medrol as well as nebulized bronchodilator therapy with duonebs ,Mucinex and antibiotic therapy with IV Rocephin . -Successfully weaned off oxygen -- Okay to discharge home on Tamiflu, prednisone, doxycycline and bronchodilators  2)Acute respiratory failure with hypoxia (HCC) - This is clearly secondary to #1. -Weaned off oxygen -Post ambulation O2 sats 88 to 91% on room air  3)Influenza A -Treated as above #1 included Tamiflu and bronchodilators  4)Essential hypertension - -Continue amlodipine and lisinopril  5)GERD without esophagitis -Continue Protonix especially while on steroids.  6)Type 2 diabetes mellitus without complications (HCC) --Recent A1c 6.5 reflecting excellent diabetic control PTA -Continue metformin  7)Dyslipidemia - -LFTs mildly elevated less than 2 times normal -Continue atorvastatin and monitor LFTs with PCP  8)BPH (benign prostatic hyperplasia) - We will continue Flomax.  9)Hypothyroidism - Recent TSH is 2.92 -Continue levothyroxine  10)Morbid Obesity- -Low calorie diet, portion control and increase physical activity discussed with patient -Body mass index is 40.45 kg/m.  Discharge Condition: Stable, without hypoxia  Follow UP   Follow-up Information     Rebekah Chesterfield, NP. Schedule an appointment as soon as possible for a visit in 1 week(s).   Specialty: Internal Medicine Contact information: [address] [place]  [address] [phone]                  Diet and Activity recommendation:  As advised  Discharge Instructions    Discharge Instructions     Call MD for:  difficulty breathing, headache or visual disturbances   Complete by: As directed    Call MD for:  persistant dizziness or light-headedness   Complete by: As directed    Call MD for:  persistant nausea and vomiting   Complete by: As directed    Call MD for:  temperature >100.4   Complete by: As directed    Diet - low sodium heart healthy   Complete by: As directed    Discharge instructions  Complete by: As directed    1)Take Medications as prescribed 2)follow up with Rebekah Chesterfield, NP (Primary Care Physician) in 1 week  3)Morbid Obesity- -Low calorie diet, portion control and increase physical activity discussed with patient -Body mass index is 40.45 kg/m.   Increase activity slowly   Complete by: As directed         Discharge Medications     Allergies as of 04/11/2023   No Known Allergies      Medication List     TAKE these medications    acebutolol 200 MG capsule Commonly known as: SECTRAL TAKE 2 CAPSULES BY MOUTH IN THE MORNING AND 1 IN THE EVENING MUST  HAVE  OFFICE  VISIT  FOR  ADDITIONAL  REFILLS What changed: See the new instructions.   acetaminophen 325 MG tablet Commonly known as: TYLENOL Take 2 tablets (650 mg total) by mouth every 6 (six) hours as needed for mild pain (pain score 1-3) (or Fever >/= 101).   albuterol 108 (90 Base) MCG/ACT inhaler Commonly known as: VENTOLIN HFA Inhale 2 puffs into the lungs every 4 (four) hours as needed for wheezing or shortness of breath. What changed:  how much to take when to take this   amLODipine 10 MG tablet Commonly known as: NORVASC Take 1 tablet (10 mg total) by mouth daily.   aspirin 81 MG tablet Take 1 tablet (81 mg total) by mouth daily with breakfast. Reported on 06/11/2015 What changed: when to take this   atorvastatin 20 MG  tablet Commonly known as: LIPITOR Take 1 tablet (20 mg total) by mouth daily.   doxycycline 100 MG tablet Commonly known as: VIBRA-TABS Take 1 tablet (100 mg total) by mouth 2 (two) times daily for 5 days.   Febuxostat 80 MG Tabs Take 80 mg by mouth daily.   fluticasone furoate-vilanterol 200-25 MCG/INH Aepb Commonly known as: BREO ELLIPTA Inhale 1 puff into the lungs daily.   levothyroxine 112 MCG tablet Commonly known as: SYNTHROID Take 1 tablet (112 mcg total) by mouth daily before breakfast. What changed: when to take this   lisinopril 20 MG tablet Commonly known as: ZESTRIL Take 1 tablet (20 mg total) by mouth daily.   metFORMIN 500 MG tablet Commonly known as: GLUCOPHAGE Take 1 tablet (500 mg total) by mouth 2 (two) times daily with a meal.   oseltamivir 75 MG capsule Commonly known as: Tamiflu Take 1 capsule (75 mg total) by mouth 2 (two) times daily for 5 days.   pantoprazole 40 MG tablet Commonly known as: PROTONIX Take 1 tablet (40 mg total) by mouth daily. What changed: when to take this   predniSONE 20 MG tablet Commonly known as: DELTASONE Take 2 tablets (40 mg total) by mouth daily with breakfast for 5 days.       Major procedures and Radiology Reports - PLEASE review detailed and final reports for all details, in brief -   DG Chest Port 1 View Result Date: 04/09/2023 CLINICAL DATA:  Possible sepsis shortness of breath, initial encounter EXAM: PORTABLE CHEST 1 VIEW COMPARISON:  02/12/2023 FINDINGS: Cardiac shadow is enlarged. Lungs are well aerated bilaterally. No focal infiltrate or sizable effusion is seen. No bony abnormality is noted. IMPRESSION: No active disease. Electronically Signed   By: Alcide Clever M.D.   On: 04/09/2023 20:09   Micro Results  Recent Results (from the past 240 hours)  Resp panel by RT-PCR (RSV, Flu A&B, Covid) Anterior Nasal Swab     Status:  Abnormal   Collection Time: 04/09/23  6:40 PM   Specimen: Anterior Nasal Swab   Result Value Ref Range Status   SARS Coronavirus 2 by RT PCR NEGATIVE NEGATIVE Final    Comment: (NOTE) SARS-CoV-2 target nucleic acids are NOT DETECTED.  The SARS-CoV-2 RNA is generally detectable in upper respiratory specimens during the acute phase of infection. The lowest concentration of SARS-CoV-2 viral copies this assay can detect is 138 copies/mL. A negative result does not preclude SARS-Cov-2 infection and should not be used as the sole basis for treatment or other patient management decisions. A negative result may occur with  improper specimen collection/handling, submission of specimen other than nasopharyngeal swab, presence of viral mutation(s) within the areas targeted by this assay, and inadequate number of viral copies(<138 copies/mL). A negative result must be combined with clinical observations, patient history, and epidemiological information. The expected result is Negative.  Fact Sheet for Patients:  BloggerCourse.com  Fact Sheet for Healthcare Providers:  SeriousBroker.it  This test is no t yet approved or cleared by the [place] FDA and  has been authorized for detection and/or diagnosis of SARS-CoV-2 by FDA under an Emergency Use Authorization (EUA). This EUA will remain  in effect (meaning this test can be used) for the duration of the COVID-19 declaration under Section 564(b)(1) of the Act, 21 U.S.C.section 360bbb-3(b)(1), unless the authorization is terminated  or revoked sooner.       Influenza A by PCR POSITIVE (A) NEGATIVE Final   Influenza B by PCR NEGATIVE NEGATIVE Final    Comment: (NOTE) The Xpert Xpress SARS-CoV-2/FLU/RSV plus assay is intended as an aid in the diagnosis of influenza from Nasopharyngeal swab specimens and should not be used as a sole basis for treatment. Nasal washings and aspirates are unacceptable for Xpert Xpress SARS-CoV-2/FLU/RSV testing.  Fact Sheet for  Patients: BloggerCourse.com  Fact Sheet for Healthcare Providers: SeriousBroker.it  This test is not yet approved or cleared by the [place] FDA and has been authorized for detection and/or diagnosis of SARS-CoV-2 by FDA under an Emergency Use Authorization (EUA). This EUA will remain in effect (meaning this test can be used) for the duration of the COVID-19 declaration under Section 564(b)(1) of the Act, 21 U.S.C. section 360bbb-3(b)(1), unless the authorization is terminated or revoked.     Resp Syncytial Virus by PCR NEGATIVE NEGATIVE Final    Comment: (NOTE) Fact Sheet for Patients: BloggerCourse.com  Fact Sheet for Healthcare Providers: SeriousBroker.it  This test is not yet approved or cleared by the [place] FDA and has been authorized for detection and/or diagnosis of SARS-CoV-2 by FDA under an Emergency Use Authorization (EUA). This EUA will remain in effect (meaning this test can be used) for the duration of the COVID-19 declaration under Section 564(b)(1) of the Act, 21 U.S.C. section 360bbb-3(b)(1), unless the authorization is terminated or revoked.  Performed at [facility], [address] [place], [place] [address]   Blood Culture (routine x 2)     Status: None (Preliminary result)   Collection Time: 04/09/23  7:15 PM   Specimen: BLOOD RIGHT FOREARM  Result Value Ref Range Status   Specimen Description   Final    BLOOD RIGHT FOREARM BOTTLES DRAWN AEROBIC AND ANAEROBIC   Special Requests Blood Culture adequate volume  Final   Culture   Final    NO GROWTH 2 DAYS Performed at [facility], [address], [place], [place] [address]    Report Status PENDING  Incomplete  Blood Culture (  routine x 2)     Status: None (Preliminary result)   Collection Time: 04/09/23  7:15 PM   Specimen: BLOOD RIGHT FOREARM  Result Value Ref Range Status   Specimen  Description   Final    BLOOD RIGHT FOREARM BOTTLES DRAWN AEROBIC AND ANAEROBIC   Special Requests Blood Culture adequate volume  Final   Culture   Final    NO GROWTH 2 DAYS Performed at [facility], [address], [place], [place] [address]    Report Status PENDING  Incomplete    Today   Subjective    Keenan Trefry today has no new concerns  -No fever  Or chills  No Nausea, Vomiting or Diarrhea --Successfully weaned off oxygen--     --Post ambulation O2 sats 88 to 91% on room air -Cough and wheezing has improved No dyspnea at rest---   Patient has been seen and examined prior to discharge   Objective   Blood pressure 128/71, pulse 72, temperature 97.7 F (36.5 C), temperature source Oral, resp. rate 19, height 5\' 11"  (1.803 m), weight 131.5 kg, SpO2 94%.   Intake/Output Summary (Last 24 hours) at 04/11/2023 1140 Last data filed at 04/10/2023 2200 Gross per 24 hour  Intake 1386.32 ml  Output --  Net 1386.32 ml    Exam Gen:- Awake Alert, no acute distress, no conversational dyspnea HEENT:- [place].AT, No sclera icterus Neck-Supple Neck,No JVD,.  Lungs-  improved air movement, No wheezing,  CV- S1, S2 normal, regular Abd-  +ve B.Sounds, Abd Soft, No tenderness,    Extremity/Skin:- No  edema,   good pulses Psych-affect is appropriate, oriented x3 Neuro--no new focal deficits, no tremors    Data Review   CBC w Diff:  Lab Results  Component Value Date   WBC 16.0 (H) 04/11/2023   HGB 12.8 (L) 04/11/2023   HGB 16.0 04/08/2022   HCT 39.8 04/11/2023   HCT 45.5 04/08/2022   PLT 220 04/11/2023   PLT 263 04/08/2022   LYMPHOPCT 3 04/09/2023   MONOPCT 8 04/09/2023   EOSPCT 2 04/09/2023   BASOPCT 1 04/09/2023    CMP:  Lab Results  Component Value Date   NA 135 04/11/2023   NA 141 04/08/2022   K 4.4 04/11/2023   CL 99 04/11/2023   CO2 27 04/11/2023   BUN 27 (H) 04/11/2023   BUN 17 04/08/2022   CREATININE 1.08 04/11/2023   CREATININE 1.10 05/11/2014   PROT 7.6  04/09/2023   PROT 7.2 10/31/2021   ALBUMIN 4.0 04/09/2023   ALBUMIN 4.5 10/31/2021   BILITOT 1.0 04/09/2023   BILITOT 0.6 10/31/2021   ALKPHOS 50 04/09/2023   AST 70 (H) 04/09/2023   ALT 63 (H) 04/09/2023  .  Total Discharge time is about 33 minutes  Shon Hale M.D on 04/11/2023 at 11:40 AM  Go to [email or web address] -  for contact info  [facility] - Office  [phone]

## 2023-04-14 LAB — CULTURE, BLOOD (ROUTINE X 2)
Culture: NO GROWTH
Culture: NO GROWTH
Special Requests: ADEQUATE
Special Requests: ADEQUATE

## 2024-03-21 NOTE — Progress Notes (Unsigned)
" °  Cardiology Office Note:   Date:  03/21/2024  ID:  David Crosby, DOB [birth year], MRN [id number] PCP: Renato Dorothey HERO, NP  [facility] Providers Cardiologist:  Lynwood Schilling, MD {  History of Present Illness:   David Crosby is a 59 y.o. male He saw Dr. Alvan in 2017 for palpitations.  He had no significant arrhythmias on a monitor.  Prior to this in 2015 he had a negative Lexiscan  Myoview .  I saw him in 2023 for evaluation of palpitations.  No further work up was suggested.  At the last visit with us  in 2024 he had DOE and had no ischemia on perfusion imaging and no abnormal findings on echo.  He returns for follow up.    ***   ROS: ***  Studies Reviewed:    EKG:       ***  Risk Assessment/Calculations:   {Does this patient have ATRIAL FIBRILLATION?:[phone]} No BP recorded.  {Refresh Note OR Click here to enter BP  :1}***        Physical Exam:   VS:  There were no vitals taken for this visit.   Wt Readings from Last 3 Encounters:  04/09/23 290 lb (131.5 kg)  02/12/23 298 lb 4.5 oz (135.3 kg)  06/30/22 288 lb 12.5 oz (131 kg)     GEN: Well nourished, well developed in no acute distress NECK: No JVD; No carotid bruits CARDIAC: ***RR, *** murmurs, rubs, gallops RESPIRATORY:  Clear to auscultation without rales, wheezing or rhonchi  ABDOMEN: Soft, non-tender, non-distended EXTREMITIES:  No edema; No deformity   ASSESSMENT AND PLAN:   Dyspnea on exertion:  ***  -somewhat improved.  Now working with his PCP and had pulmonary function test.  Using inhaler for mild COPD.  Echocardiogram and nuclear stress test reassuring.  Details above.  Lab work unremarkable.  DOE does not appear to be related to cardiac issues. Increase physical activity as tolerated Follow-up with PCP   Essential hypertension:  *** -BP today 132/84.  Tolerating increased amlodipine  well. Continue current medical therapy Heart healthy low-sodium diet Increase physical activity as  tolerated   Palpitations:  *** -denies recent episodes of accelerated or irregular heart rate.  Previous cardiac event monitor showed no significant arrhythmia. Continue acebutolol  Avoid triggers   Hyperlipidemia:  *** -LDL 103 8/23. Continue rosuvastatin  Heart healthy low-sodium high-fiber diet Follows with pcp   Preoperative cardiac evaluation:  *** -right shoulder surgery     Follow up ***  Signed, Lynwood Schilling, MD   "

## 2024-03-23 ENCOUNTER — Ambulatory Visit: Admitting: Cardiology

## 2024-03-23 DIAGNOSIS — R0602 Shortness of breath: Secondary | ICD-10-CM

## 2024-03-23 DIAGNOSIS — E785 Hyperlipidemia, unspecified: Secondary | ICD-10-CM

## 2024-03-23 DIAGNOSIS — I10 Essential (primary) hypertension: Secondary | ICD-10-CM

## 2024-03-23 DIAGNOSIS — R002 Palpitations: Secondary | ICD-10-CM
